# Patient Record
Sex: Female | Born: 2000
Health system: Southern US, Community
[De-identification: ages and names within clinical notes are randomized; demographics above are authoritative.]

## PROBLEM LIST (undated history)

## (undated) DIAGNOSIS — E282 Polycystic ovarian syndrome: Secondary | ICD-10-CM

## (undated) DIAGNOSIS — R519 Headache, unspecified: Secondary | ICD-10-CM

## (undated) DIAGNOSIS — R87619 Unspecified abnormal cytological findings in specimens from cervix uteri: Secondary | ICD-10-CM

## (undated) DIAGNOSIS — R002 Palpitations: Secondary | ICD-10-CM

## (undated) HISTORY — DX: Unspecified abnormal cytological findings in specimens from cervix uteri: R87.619

## (undated) HISTORY — PX: NO PAST SURGERIES: SHX2092

## (undated) HISTORY — DX: Headache, unspecified: R51.9

## (undated) HISTORY — DX: Palpitations: R00.2

## (undated) HISTORY — DX: Polycystic ovarian syndrome: E28.2

---
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
## 2018-01-06 ENCOUNTER — Encounter: Payer: Self-pay | Admitting: Physical Therapy

## 2018-01-06 ENCOUNTER — Ambulatory Visit: Payer: No Typology Code available for payment source | Admitting: Physical Therapy

## 2018-01-06 DIAGNOSIS — G8929 Other chronic pain: Secondary | ICD-10-CM

## 2018-01-06 DIAGNOSIS — M25511 Pain in right shoulder: Principal | ICD-10-CM

## 2018-01-06 DIAGNOSIS — M6281 Muscle weakness (generalized): Secondary | ICD-10-CM

## 2018-01-06 NOTE — Therapy (Signed)
[facility] Outpatient Rehabilitation [facility] [address] [place], [place], [address] Phone: [phone]   Fax:  [phone]  Physical Therapy Treatment  Patient Details  Name: Heather Mcneil MRN: [id number] Date of Birth: 25-Aug-2000 Referring Provider: Martha Clan MD    Encounter Date: 01/06/2018  PT End of Session - 01/06/18 1444    Visit Number  2    Number of Visits  9    Date for PT Re-Evaluation  01/20/18    Authorization Type  [facility] [employer] [employer]     Authorization Time Period  12/23/17 to 01/23/18    PT Start Time  1444    PT Stop Time  1530    PT Time Calculation (min)  46 min       History reviewed. No pertinent past medical history.  History reviewed. No pertinent surgical history.  There were no vitals filed for this visit.  Subjective Assessment - 01/06/18 1446    Subjective  Shoulder is feeling pretty good, only hurts when I move it overhead.     Currently in Pain?  No/denies                       [facility] Adult PT Treatment/Exercise - 01/06/18 0001      Shoulder Exercises: Prone   Retraction  15 reps    Extension  10 reps retraction + extension    Other Prone Exercises  90/90 lift from table; blackburn 1-4 in qped    Other Prone Exercises  Qped: Lt arm raise, green ball plyo rolls; protraction on elbows      Shoulder Exercises: Sidelying   Flexion  Right;10 reps;Weights 3 sets    ABduction  Right;10 reps;Weights 3 sets    ABduction Weight (lbs)  1      Shoulder Exercises: Body Blade   Other Body Blade Exercises  x30s ER, scaption, flexion in front of body, overhead      Manual Therapy   Manual Therapy  Taping    Soft tissue mobilization  IASTM Rt deltoid    Kinesiotex  Facilitate Muscle      Kinesiotix   Facilitate Muscle   scap retraction               PT Short Term Goals - 12/23/17 1307      PT SHORT TERM GOAL #1   Title  Patient to be compliant with appropriate HEP, to be updated PRN     Time  1    Period  Weeks    Status  New    Target Date  12/30/17      PT SHORT TERM GOAL #2   Title  Patient to report no  R shoulder pain when reaching overhead in order to improve QOL and assist in return to sport     Time  4    Period  Weeks    Status  New    Target Date  01/20/18      PT SHORT TERM GOAL #3   Title  Patient to be able to perform overhead tasks without scapular winging in order to show improved coordination and stability and assist in return to sport     Time  4    Period  Weeks    Status  New      PT SHORT TERM GOAL #4   Title  Patient to demonsrate MMT as being 5/5 in all tested groups in order to assist in return to  sport without pain     Time  4    Period  Weeks    Status  New      PT SHORT TERM GOAL #5   Title  Patient to be able to perform volleyball sports specific activities without pain in order to allow her to safely return to sport without fear of reinjury     Time  4    Period  Weeks    Status  New               Plan - 01/06/18 1550    Clinical Impression Statement  Fatigue notable when activating periscapular musculature. Overactivation of upper trap in motions above90 deg. Asked pt to transition HEP into a quadruped position. Discussed importance of postural alignment for shoulder movement.     PT Treatment/Interventions  ADLs/Self Care Home Management;Biofeedback;Cryotherapy;Electrical Stimulation;Iontophoresis 4mg /ml Dexamethasone;Moist Heat;Ultrasound;Functional mobility training;Therapeutic activities;Therapeutic exercise;Balance training;Neuromuscular re-education;Patient/family education;Manual techniques;Passive range of motion;Dry needling;Taping    PT Next Visit Plan  traps, rhomboids, lats strengthening, work on overhead strengthening and scapular coordination     PT Home Exercise Plan  Eval: blackburns exercises 1-4 in qped, thoracic extension     Consulted and Agree with Plan of Care  Patient       Patient will benefit from skilled  therapeutic intervention in order to improve the following deficits and impairments:  Improper body mechanics, Pain, Increased muscle spasms, Postural dysfunction, Decreased strength, Decreased range of motion, Impaired UE functional use  Visit Diagnosis: Chronic right shoulder pain  Muscle weakness (generalized)     Problem List There are no active problems to display for this patient.   Abhay Godbolt C. Ashely Joshua PT, DPT 01/06/18 3:52 PM   [facility] [address] [place], [place], [address] Phone: [phone]   Fax:  [phone]  Name: Virgina Norfolkshley V Mistry MRN: [id number] Date of Birth: 02/24/2001

## 2018-01-08 ENCOUNTER — Ambulatory Visit: Payer: No Typology Code available for payment source | Admitting: Physical Therapy

## 2018-01-08 ENCOUNTER — Encounter: Payer: Self-pay | Admitting: Physical Therapy

## 2018-01-08 DIAGNOSIS — M25511 Pain in right shoulder: Principal | ICD-10-CM

## 2018-01-08 DIAGNOSIS — G8929 Other chronic pain: Secondary | ICD-10-CM

## 2018-01-08 DIAGNOSIS — M6281 Muscle weakness (generalized): Secondary | ICD-10-CM

## 2018-01-08 NOTE — Therapy (Signed)
[facility] [address] [place], [place], [address] Phone: [phone]   Fax:  [phone]  Physical Therapy Treatment  Patient Details  Name: Heather Mcneil MRN: [id number] Date of Birth: 03/29/2001 Referring Provider: Martha ClanWilliam [name] MD    Encounter Date: 01/08/2018  PT End of Session - 01/08/18 1317    Visit Number  3    Number of Visits  9    Authorization Type  [facility] [employer]     PT Start Time  1315    PT Stop Time  1354    PT Time Calculation (min)  39 min    Activity Tolerance  Patient tolerated treatment well    Behavior During Therapy  [facility] for tasks assessed/performed       History reviewed. No pertinent past medical history.  History reviewed. No pertinent surgical history.  There were no vitals filed for this visit.  Subjective Assessment - 01/08/18 1317    Subjective  Feeling sore but denies pain    Patient Stated Goals  get back to volleyball without pain (starts October, workouts start in August)    Currently in Pain?  No/denies                       [facility] Treatment/Exercise - 01/08/18 0001      Exercises   Exercises  Other Exercises      Pilates   Pilates Reformer  reformer: planks, supine arm work      Shoulder Exercises: Prone   Other Prone Exercises  qped with yellow band: flexion, triceps extension, abd      Shoulder Exercises: Standing   External Rotation  Both;10 reps 3 sets, elbows on table, yellow tband    Other Standing Exercises  overhead reach yellow tband    Other Standing Exercises  overhead tennis ball bounce on wall      Shoulder Exercises: ROM/Strengthening   UBE (Upper Arm Bike)  retro L1 3 min      Manual Therapy   Soft tissue mobilization  IASTM Rt deltoid               PT Short Term Goals - 12/23/17 1307      PT SHORT TERM GOAL #1   Title  Patient to be compliant with appropriate HEP, to be updated PRN     Time  1    Period  Weeks    Status  New    Target Date  12/30/17      PT SHORT TERM GOAL #2   Title  Patient to report no  R shoulder pain when reaching overhead in order to improve QOL and assist in return to sport     Time  4    Period  Weeks    Status  New    Target Date  01/20/18      PT SHORT TERM GOAL #3   Title  Patient to be able to perform overhead tasks without scapular winging in order to show improved coordination and stability and assist in return to sport     Time  4    Period  Weeks    Status  New      PT SHORT TERM GOAL #4   Title  Patient to demonsrate MMT as being 5/5 in all tested groups in order to assist in return to sport without pain     Time  4    Period  Weeks    Status  New      PT SHORT TERM GOAL #5   Title  Patient to be able to perform volleyball sports specific activities without pain in order to allow her to safely return to sport without fear of reinjury     Time  4    Period  Weeks    Status  New               Plan - 01/08/18 1354    Clinical Impression Statement  Added yellow tband to qped exercises with fatigue but no pain. Pt able to correct scapular protraction with minimal assist at this visit. Pinching in shoulder noted on reformer when supine circles were wide to return overhead. pt is progressing well and will d/c at next visit if she has no pain.     PT Treatment/Interventions  ADLs/Self Care Home Management;Biofeedback;Cryotherapy;Electrical Stimulation;Iontophoresis 4mg /ml Dexamethasone;Moist Heat;Ultrasound;Functional mobility training;Therapeutic activities;Therapeutic exercise;Balance training;Neuromuscular re-education;Patient/family education;Manual techniques;Passive range of motion;Dry needling;Taping    PT Next Visit Plan  ask about ktape, d/c if no pain    PT Home Exercise Plan  Eval: blackburns exercises 1-4 in qped with yellow tband, thoracic extension; overhead ball bounce    Consulted and Agree with Plan of Care  Patient       Patient will  benefit from skilled therapeutic intervention in order to improve the following deficits and impairments:  Improper body mechanics, Pain, Increased muscle spasms, Postural dysfunction, Decreased strength, Decreased range of motion, Impaired UE functional use  Visit Diagnosis: Chronic right shoulder pain  Muscle weakness (generalized)     Problem List There are no active problems to display for this patient.  Kramer Hanrahan C. Nataleah Scioneaux PT, DPT 01/08/18 1:57 PM   [facility] [address] [place], [place], [address] Phone: [phone]   Fax:  [phone]  Name: Heather Mcneil MRN: [id number] Date of Birth: Aug 11, 2000

## 2018-01-13 ENCOUNTER — Ambulatory Visit: Payer: No Typology Code available for payment source | Attending: Internal Medicine | Admitting: Physical Therapy

## 2018-01-13 ENCOUNTER — Encounter: Payer: Self-pay | Admitting: Physical Therapy

## 2018-01-13 DIAGNOSIS — G8929 Other chronic pain: Secondary | ICD-10-CM | POA: Diagnosis present

## 2018-01-13 DIAGNOSIS — M25511 Pain in right shoulder: Secondary | ICD-10-CM | POA: Diagnosis not present

## 2018-01-13 DIAGNOSIS — M6281 Muscle weakness (generalized): Secondary | ICD-10-CM | POA: Insufficient documentation

## 2018-01-13 NOTE — Therapy (Signed)
[facility] Outpatient Rehabilitation [facility] [address] [place], [place], [address] Phone: [phone]   Fax:  [phone]  Physical Therapy Treatment  Patient Details  Name: Heather Mcneil MRN: [id number] Date of Birth: 2001-02-12 Referring Provider: Martha Clan MD    Encounter Date: 01/13/2018  PT End of Session - 01/13/18 1447    Visit Number  4    Number of Visits  9    Date for PT Re-Evaluation  01/20/18    Authorization Type  [facility] [employer] [employer]     Authorization Time Period  12/23/17 to 01/23/18    PT Start Time  1447    PT Stop Time  1526    PT Time Calculation (min)  39 min    Activity Tolerance  Patient tolerated treatment well    Behavior During Therapy  [facility] for tasks assessed/performed       History reviewed. No pertinent past medical history.  History reviewed. No pertinent surgical history.  There were no vitals filed for this visit.  Subjective Assessment - 01/13/18 1447    Subjective  Some pain when wiping a table and twinges while passing in volleyball.     Patient Stated Goals  get back to volleyball without pain (starts October, workouts start in August)    Currently in Pain?  No/denies         [facility] PT Assessment - 01/13/18 0001      AROM   Overall AROM Comments  all WFL, mild discomfort Rt GHJ IR      Strength   Right Shoulder Extension  4+/5    Right Shoulder Internal Rotation  5/5    Right Shoulder External Rotation  5/5    Right Shoulder Horizontal ABduction  4+/5    Left Shoulder Extension  5/5    Left Shoulder Internal Rotation  5/5    Left Shoulder External Rotation  5/5    Left Shoulder Horizontal ABduction  5/5    Right Elbow Flexion  5/5    Right Elbow Extension  5/5    Left Elbow Flexion  5/5    Left Elbow Extension  5/5      Palpation   Palpation comment  mild tenderness medial head of biceps and AC joint                   [facility] Adult PT Treatment/Exercise - 01/13/18 0001      Pilates   Pilates Reformer  supine arm work    Customer service manager  straight arm press bar, qped UE ext, high kneeling resisted ext from overhead, side plank resisted ADDuction all yellow      Manual Therapy   Manual therapy comments  skilled palpation and monitoring during TPDN    Soft tissue mobilization  deltoid       Trigger Point Dry Needling - 01/13/18 1529    Consent Given?  Yes    Education Handout Provided  -- verbal education    Muscles Treated Upper Body  -- deltoid           PT Education - 01/13/18 1528    Education Details  goals discussion, POC, TPDN & expected outcomes, exercise form/rationale. HEP    Person(s) Educated  Patient    Methods  Explanation;Demonstration;Tactile cues;Verbal cues;Handout    Comprehension  Verbalized understanding;Returned demonstration;Verbal cues required;Tactile cues required;Need further instruction       PT Short Term Goals - 01/13/18 1454      PT SHORT TERM  GOAL #1   Title  Patient to be compliant with appropriate HEP, to be updated PRN     Baseline  compliant and progressing    Status  On-going      PT SHORT TERM GOAL #2   Title  Patient to report no  R shoulder pain when reaching overhead in order to improve QOL and assist in return to sport     Baseline  some twinges    Status  On-going      PT SHORT TERM GOAL #3   Title  Patient to be able to perform overhead tasks without scapular winging in order to show improved coordination and stability and assist in return to sport     Baseline  winging noted with resistance overhead.    Status  On-going      PT SHORT TERM GOAL #4   Title  Patient to demonsrate MMT as being 5/5 in all tested groups in order to assist in return to sport without pain     Baseline  see flowsheet    Status  On-going      PT SHORT TERM GOAL #5   Title  Patient to be able to perform volleyball sports specific activities without pain in order to allow her to safely return to sport without fear of reinjury      Baseline  twinges when setting     Status  On-going               Plan - 01/13/18 1526    Clinical Impression Statement  Poor core activation in overhead motion leading to scapular winging and impingement. DN to medial head of biceps which created soreness with TTP- pt reported soreness as expected. Making progress toward goals but will benefit from further challenges    PT Treatment/Interventions  ADLs/Self Care Home Management;Biofeedback;Cryotherapy;Electrical Stimulation;Iontophoresis 4mg /ml Dexamethasone;Moist Heat;Ultrasound;Functional mobility training;Therapeutic activities;Therapeutic exercise;Balance training;Neuromuscular re-education;Patient/family education;Manual techniques;Passive range of motion;Dry needling;Taping    PT Next Visit Plan  dynamic overhead challenges- core activation cues    PT Home Exercise Plan  Eval: blackburns exercises 1-4 in qped with yellow tband, thoracic extension; overhead ball bounce, high kneeling resisted ext from overhead, side plank resisted adduction    Consulted and Agree with Plan of Care  Patient       Patient will benefit from skilled therapeutic intervention in order to improve the following deficits and impairments:  Improper body mechanics, Pain, Increased muscle spasms, Postural dysfunction, Decreased strength, Decreased range of motion, Impaired UE functional use  Visit Diagnosis: Chronic right shoulder pain  Muscle weakness (generalized)     Problem List There are no active problems to display for this patient.   Quintessa Simmerman C. Chaneka Trefz PT, DPT 01/13/18 3:30 PM   [facility] [address] [place], [place], [address] Phone: [phone]   Fax:  [phone]  Name: Heather Mcneil MRN: [id number] Date of Birth: 07/03/2000

## 2018-01-15 ENCOUNTER — Ambulatory Visit: Payer: No Typology Code available for payment source | Admitting: Physical Therapy

## 2018-01-20 ENCOUNTER — Ambulatory Visit: Payer: No Typology Code available for payment source | Admitting: Physical Therapy

## 2018-01-20 ENCOUNTER — Encounter: Payer: Self-pay | Admitting: Physical Therapy

## 2018-01-20 DIAGNOSIS — M25511 Pain in right shoulder: Principal | ICD-10-CM

## 2018-01-20 DIAGNOSIS — M6281 Muscle weakness (generalized): Secondary | ICD-10-CM

## 2018-01-20 DIAGNOSIS — G8929 Other chronic pain: Secondary | ICD-10-CM

## 2018-01-20 NOTE — Therapy (Signed)
[facility] [address] [place], [place], [address] Phone: [phone]   Fax:  [phone]  Physical Therapy Treatment  Patient Details  Name: Heather Mcneil MRN: [id number] Date of Birth: 01/21/2001 Referring Provider: Martha ClanWilliam [name] MD    Encounter Date: 01/20/2018  PT End of Session - 01/20/18 1311    Visit Number  5    Number of Visits  9    Date for PT Re-Evaluation  01/20/18    Authorization Type  [facility]     Authorization Time Period  12/23/17 to 01/23/18    PT Start Time  1310    PT Stop Time  1355    PT Time Calculation (min)  45 min    Activity Tolerance  Patient tolerated treatment well    Behavior During Therapy  [facility] assessed/per[facility]       History reviewed. No pertinent past medical history.  History reviewed. No pertinent surgical history.  There were no vitals filed for this visit.  Subjective Assessment - 01/20/18 1312    Subjective  Denies pain since last visit. mild sore post DN.    Patient Stated Goals  get back to volleyball without pain (starts October, workouts start in August)    Currently in Pain?  No/denies                       [facility] Treatment/Exercise - 01/20/18 0001      Exercises   Exercises  Shoulder    Other Exercises   exam of playing volleyball      Pilates   Pilates Tower  scaption pull yellow      Shoulder Exercises: Supine   Other Supine Exercises  bridge+mini sets      Shoulder Exercises: Prone   Other Prone Exercises  flexion in child pose      Shoulder Exercises: Sidelying   Other Sidelying Exercises  adduction yellow      Shoulder Exercises: Standing   Other Standing Exercises  wall angel rolling ball on wall    Other Standing Exercises  small ball (green) circles on wall      Shoulder Exercises: Stretch   Other Shoulder Stretches  child pose      Manual Therapy   Manual therapy comments  edu on use of theracane    Soft  tissue mobilization  Rt upper trap, scalenes, suboccipital release               PT Short Term Goals - 01/13/18 1454      PT SHORT TERM GOAL #1   Title  Patient to be compliant with appropriate HEP, to be updated PRN     Baseline  compliant and progressing    Status  On-going      PT SHORT TERM GOAL #2   Title  Patient to report no  R shoulder pain when reaching overhead in order to improve QOL and assist in return to sport     Baseline  some twinges    Status  On-going      PT SHORT TERM GOAL #3   Title  Patient to be able to perform overhead tasks without scapular winging in order to show improved coordination and stability and assist in return to sport     Baseline  winging noted with resistance overhead.    Status  On-going      PT SHORT TERM GOAL #4   Title  Patient to demonsrate MMT as being 5/5 in all tested groups in order to assist in return to sport without pain     Baseline  see flowsheet    Status  On-going      PT SHORT TERM GOAL #5   Title  Patient to be able to perform volleyball sports specific activities without pain in order to allow her to safely return to sport without fear of reinjury     Baseline  twinges when setting     Status  On-going               Plan - 01/20/18 1313    Clinical Impression Statement  *correction from last visit- Medial head of DELTOID was DN.  Pt reported being stressed today which was notable by tightness in her upper traps. Pt is able to perform overhead motions but does demo instability in Rt shoulder vs Lt. I asked her to try all activities that she knew to create shoulder pain in the past and we will determine d/c at next visit based on outcomes.     PT Treatment/Interventions  ADLs/Self Care Home Management;Biofeedback;Cryotherapy;Electrical Stimulation;Iontophoresis 4mg /ml Dexamethasone;Moist Heat;Ultrasound;Functional mobility training;Therapeutic activities;Therapeutic exercise;Balance training;Neuromuscular  re-education;Patient/family education;Manual techniques;Passive range of motion;Dry needling;Taping    PT Next Visit Plan  dynamic overhead challenges- core activation cues    PT Home Exercise Plan  Eval: blackburns exercises 1-4 in qped with yellow tband, thoracic extension; overhead ball bounce, high kneeling resisted ext from overhead, side plank resisted adduction; GHJ flx in child pose, wall angel with ball    Consulted and Agree with Plan of Care  Patient       Patient will benefit from skilled therapeutic intervention in order to improve the following deficits and impairments:  Improper body mechanics, Pain, Increased muscle spasms, Postural dysfunction, Decreased strength, Decreased range of motion, Impaired UE functional use  Visit Diagnosis: Chronic right shoulder pain  Muscle weakness (generalized)     Problem List There are no active problems to display for this patient.   Zayra Devito C. Constantina Laseter PT, DPT 01/20/18 1:58 PM   [facility] [address] [place], [place], [address] Phone: [phone]   Fax:  [phone]  Name: Heather Mcneil MRN: [id number] Date of Birth: 01/25/2001

## 2018-01-22 ENCOUNTER — Encounter: Payer: Self-pay | Admitting: Physical Therapy

## 2018-01-22 ENCOUNTER — Ambulatory Visit: Payer: No Typology Code available for payment source | Admitting: Physical Therapy

## 2018-01-22 DIAGNOSIS — M25511 Pain in right shoulder: Secondary | ICD-10-CM | POA: Diagnosis not present

## 2018-01-22 DIAGNOSIS — M6281 Muscle weakness (generalized): Secondary | ICD-10-CM

## 2018-01-22 DIAGNOSIS — G8929 Other chronic pain: Secondary | ICD-10-CM

## 2018-01-22 NOTE — Therapy (Signed)
[facility] [address] [place], [place], [address] Phone: [phone]   Fax:  [phone]  Physical Therapy Treatment  Patient Details  Name: Heather Mcneil MRN: [id number] Date of Birth: 05/22/2001 Referring Provider: Martha ClanWilliam [name], MD   Encounter Date: 01/22/2018  PT End of Session - 01/22/18 1321    Visit Number  6    Number of Visits  14    Date for PT Re-Evaluation  02/21/18    Authorization Type  [employer]     Authorization Time Period  --    PT Start Time  1318    PT Stop Time  1400    PT Time Calculation (min)  42 min    Activity Tolerance  Patient tolerated treatment well    Behavior During Therapy  [facility] for tasks assessed/performed       History reviewed. No pertinent past medical history.  History reviewed. No pertinent surgical history.  There were no vitals filed for this visit.  Subjective Assessment - 01/22/18 1320    Subjective  I tried playing volleyball, when I tried to hit like I normally do it hurt and hurt when setting after that.     Patient Stated Goals  get back to volleyball without pain (starts October, workouts start in August)    Currently in Pain?  Yes    Pain Score  1     Pain Location  Shoulder    Pain Orientation  Right    Pain Descriptors / Indicators  Sore         [facility] PT Assessment - 01/22/18 0001      Assessment   Medical Diagnosis  shoulder pain     Referring Provider  Martha ClanWilliam [name], MD                   [facility] Treatment/Exercise - 01/22/18 0001      Exercises   Exercises  Shoulder      Shoulder Exercises: Seated   Other Seated Exercises  Rt UE diagonal red plyo c sit on bosu& high kneeling on bosu      Shoulder Exercises: Prone   Other Prone Exercises  flexion in child pose    Other Prone Exercises  primal push up      Shoulder Exercises: Standing   Flexion Limitations  overheal ball liftoff from wall red plyo    Other Standing Exercises   high kneeling resisted IR/ER green tband- quick & slow      Shoulder Exercises: ROM/Strengthening   UBE (Upper Arm Bike)  retro 3 min L1      Shoulder Exercises: Body Blade   Flexion Limitations  at 90 in supine x3 blade //to body and perpendicular      Manual Therapy   Soft tissue mobilization  IASTM Rt deltoid               PT Short Term Goals - 01/13/18 1454      PT SHORT TERM GOAL #1   Title  Patient to be compliant with appropriate HEP, to be updated PRN     Baseline  compliant and progressing    Status  On-going      PT SHORT TERM GOAL #2   Title  Patient to report no  R shoulder pain when reaching overhead in order to improve QOL and assist in return to sport     Baseline  some twinges    Status  On-going  PT SHORT TERM GOAL #3   Title  Patient to be able to perform overhead tasks without scapular winging in order to show improved coordination and stability and assist in return to sport     Baseline  winging noted with resistance overhead.    Status  On-going      PT SHORT TERM GOAL #4   Title  Patient to demonsrate MMT as being 5/5 in all tested groups in order to assist in return to sport without pain     Baseline  see flowsheet    Status  On-going      PT SHORT TERM GOAL #5   Title  Patient to be able to perform volleyball sports specific activities without pain in order to allow her to safely return to sport without fear of reinjury     Baseline  twinges when setting     Status  On-going               Plan - 01/22/18 1537    Clinical Impression Statement  continued pain with forceful and quick overhead motions due to poor motor control. Pt has difficulty engaging abdominal wall overhead creating excess demand on shoulder. Due to continued impingement, pt will benefit from further skilled PT to properly train motor control and reduce future injury risk.     PT Frequency  2x / week    PT Duration  4 weeks    PT Treatment/Interventions   ADLs/Self Care Home Management;Biofeedback;Cryotherapy;Electrical Stimulation;Iontophoresis 4mg /ml Dexamethasone;Moist Heat;Ultrasound;Functional mobility training;Therapeutic activities;Therapeutic exercise;Balance training;Neuromuscular re-education;Patient/family education;Manual techniques;Passive range of motion;Dry needling;Taping    PT Next Visit Plan  dynamic overhead challenges- core activation cues    PT Home Exercise Plan  Eval: blackburns exercises 1-4 in qped with yellow tband, thoracic extension; overhead ball bounce, high kneeling resisted ext from overhead, side plank resisted adduction; GHJ flx in child pose, wall angel with ball, high kneeling D2 ext with weight, resisted ER at 90, primal push up + serratus;     Consulted and Agree with Plan of Care  Patient       Patient will benefit from skilled therapeutic intervention in order to improve the following deficits and impairments:  Improper body mechanics, Pain, Increased muscle spasms, Postural dysfunction, Decreased strength, Decreased range of motion, Impaired UE functional use  Visit Diagnosis: Chronic right shoulder pain - Plan: PT plan of care cert/re-cert  Muscle weakness (generalized) - Plan: PT plan of care cert/re-cert     Problem List There are no active problems to display for this patient.   Harmony Sandell C. Ryden Wainer PT, DPT 01/22/18 3:43 PM   [facility] [address] [place], [place], [address] Phone: [phone]   Fax:  [phone]  Name: Heather Mcneil MRN: [id number] Date of Birth: 03/15/2001

## 2018-01-24 MED FILL — ALYACEN 1-35-28 TABLET: 1-35 | 84 days supply | Qty: 84 | Fill #1

## 2018-02-07 ENCOUNTER — Ambulatory Visit: Payer: No Typology Code available for payment source | Admitting: Physical Therapy

## 2018-02-07 ENCOUNTER — Encounter: Payer: Self-pay | Admitting: Physical Therapy

## 2018-02-07 DIAGNOSIS — G8929 Other chronic pain: Secondary | ICD-10-CM

## 2018-02-07 DIAGNOSIS — M25511 Pain in right shoulder: Secondary | ICD-10-CM | POA: Diagnosis not present

## 2018-02-07 DIAGNOSIS — M6281 Muscle weakness (generalized): Secondary | ICD-10-CM

## 2018-02-07 NOTE — Therapy (Signed)
[facility] Outpatient Rehabilitation [facility] [address] [place], [place], [address] Phone: [phone]   Fax:  [phone]  Physical Therapy Treatment  Patient Details  Name: Heather Mcneil MRN: [id number] Date of Birth: 03-10-2001 Referring Provider: Martha Clan, MD   Encounter Date: 02/07/2018  PT End of Session - 02/07/18 0807    Visit Number  7    Number of Visits  14    Date for PT Re-Evaluation  02/21/18    Authorization Type  [employer] [employer]     PT Start Time  0807    PT Stop Time  [phone]    PT Time Calculation (min)  35 min    Activity Tolerance  Patient tolerated treatment well    Behavior During Therapy  [facility] for tasks assessed/performed       History reviewed. No pertinent past medical history.  History reviewed. No pertinent surgical history.  There were no vitals filed for this visit.  Subjective Assessment - 02/07/18 0808    Subjective  It hurt a little after our last session for a few days.     Patient Stated Goals  get back to volleyball without pain (starts October, workouts start in August)    Currently in Pain?  No/denies                       [facility] Adult PT Treatment/Exercise - 02/07/18 0001      Exercises   Exercises  Shoulder      Shoulder Exercises: Seated   Horizontal ABduction Limitations  iso hold yellow tband with bridge rollout on physioball    External Rotation  15 reps   2 sets   External Rotation Weight (lbs)  2    External Rotation Limitations  elbow on table    Other Seated Exercises  Rt UE diagonal seated on physioball 2#      Shoulder Exercises: Standing   External Rotation  20 reps;Right    Theraband Level (Shoulder External Rotation)  Level 3 (Green)    Extension  20 reps    Theraband Level (Shoulder Extension)  Level 3 (Green)    Row  20 reps    Theraband Level (Shoulder Row)  Level 4 (Blue)      Modalities   Modalities  Ultrasound      Ultrasound   Ultrasound Location  Rt GHJ    Ultrasound Parameters  1.0w/cm2 pulsed 8 min    Ultrasound Goals  Pain               PT Short Term Goals - 01/13/18 1454      PT SHORT TERM GOAL #1   Title  Patient to be compliant with appropriate HEP, to be updated PRN     Baseline  compliant and progressing    Status  On-going      PT SHORT TERM GOAL #2   Title  Patient to report no  R shoulder pain when reaching overhead in order to improve QOL and assist in return to sport     Baseline  some twinges    Status  On-going      PT SHORT TERM GOAL #3   Title  Patient to be able to perform overhead tasks without scapular winging in order to show improved coordination and stability and assist in return to sport     Baseline  winging noted with resistance overhead.    Status  On-going  PT SHORT TERM GOAL #4   Title  Patient to demonsrate MMT as being 5/5 in all tested groups in order to assist in return to sport without pain     Baseline  see flowsheet    Status  On-going      PT SHORT TERM GOAL #5   Title  Patient to be able to perform volleyball sports specific activities without pain in order to allow her to safely return to sport without fear of reinjury     Baseline  twinges when setting     Status  On-going               Plan - 02/07/18 [id number]    Clinical Impression Statement  Continued scapular winging noted creating poor stability through movement. HEP to row/ext/ER with higher level bands for increased periscapular activation. Cues required for core activation for trunk control when Rt arm works above 90 deg.     PT Treatment/Interventions  ADLs/Self Care Home Management;Biofeedback;Cryotherapy;Electrical Stimulation;Iontophoresis 4mg /ml Dexamethasone;Moist Heat;Ultrasound;Functional mobility training;Therapeutic activities;Therapeutic exercise;Balance training;Neuromuscular re-education;Patient/family education;Manual techniques;Passive range of motion;Dry needling;Taping    PT Next Visit Plan  dynamic  overhead challenges- core activation cues. US effective? consider ktape    PT Home Exercise Plan  Eval: blackburns exercises 1-4 in qped with yellow tband, thoracic extension; overhead ball bounce, high kneeling resisted ext from overhead, side plank resisted adduction; GHJ flx in child pose, wall angel with ball, high kneeling D2 ext with weight, resisted ER at 90, primal push up + serratus;     Consulted and Agree with Plan of Care  Patient       Patient will benefit from skilled therapeutic intervention in order to improve the following deficits and impairments:  Improper body mechanics, Pain, Increased muscle spasms, Postural dysfunction, Decreased strength, Decreased range of motion, Impaired UE functional use  Visit Diagnosis: Chronic right shoulder pain  Muscle weakness (generalized)     Problem List There are no active problems to display for this patient.  Brayden Brodhead C. Danaysia Rader PT, DPT 02/07/18 8:45 AM   [facility] [address] [place], [place], [address] Phone: [phone]   Fax:  [phone]  Name: Heather Mcneil MRN: [id number] Date of Birth: 07/09/2000

## 2018-02-13 ENCOUNTER — Ambulatory Visit: Payer: No Typology Code available for payment source | Admitting: Physical Therapy

## 2018-02-17 ENCOUNTER — Ambulatory Visit: Payer: No Typology Code available for payment source | Attending: Internal Medicine | Admitting: Physical Therapy

## 2018-02-17 ENCOUNTER — Encounter: Payer: Self-pay | Admitting: Physical Therapy

## 2018-02-17 DIAGNOSIS — G8929 Other chronic pain: Secondary | ICD-10-CM | POA: Diagnosis present

## 2018-02-17 DIAGNOSIS — M6281 Muscle weakness (generalized): Secondary | ICD-10-CM | POA: Diagnosis present

## 2018-02-17 DIAGNOSIS — M25511 Pain in right shoulder: Secondary | ICD-10-CM | POA: Insufficient documentation

## 2018-02-17 NOTE — Therapy (Signed)
[facility] [address] [place], [place], [address] Phone: [phone]   Fax:  [phone]  Physical Therapy Treatment  Patient Details  Name: Heather Mcneil MRN: [id number] Date of Birth: [birth year] Referring Provider: Martha Clan, MD   Encounter Date: 02/17/2018  PT End of Session - 02/17/18 1331    Visit Number  8    Number of Visits  14    Date for PT Re-Evaluation  02/28/18    Authorization Type  [facility] [employer]     Authorization Time Period  12/23/17 to 01/23/18    PT Start Time  1330    PT Stop Time  1411    PT Time Calculation (min)  41 min    Activity Tolerance  Patient tolerated treatment well    Behavior During Therapy  [facility] for tasks assessed/performed       History reviewed. No pertinent past medical history.  History reviewed. No pertinent surgical history.  There were no vitals filed for this visit.                    [facility] Adult PT Treatment/Exercise - 02/17/18 0001      Exercises   Exercises  Shoulder      Shoulder Exercises: Prone   Other Prone Exercises  flexion in child pose    Other Prone Exercises  primal push up 3x10 serratus press      Shoulder Exercises: Sidelying   External Rotation Limitations  red plyo ball, ER to abduction overhead      Shoulder Exercises: Standing   Flexion Limitations  --    Other Standing Exercises  high kneeling Rt UE diagonals red plyoball, hinge back with flexion for core activation    Other Standing Exercises  red plyoball on wall circles 90, 120, end range      Shoulder Exercises: ROM/Strengthening   UBE (Upper Arm Bike)  2:30/2:30 L2      Shoulder Exercises: Stretch   Other Shoulder Stretches  flexion at wall, door pec stretch      Shoulder Exercises: Body Blade   Other Body Blade Exercises  ER at neutral, ER+ 45 ABD, 90/90, OH  1 min each      Manual Therapy   Manual therapy comments  scapular distraction    Soft tissue mobilization   periscapular STM               PT Short Term Goals - 01/13/18 1454      PT SHORT TERM GOAL #1   Title  Patient to be compliant with appropriate HEP, to be updated PRN     Baseline  compliant and progressing    Status  On-going      PT SHORT TERM GOAL #2   Title  Patient to report no  R shoulder pain when reaching overhead in order to improve QOL and assist in return to sport     Baseline  some twinges    Status  On-going      PT SHORT TERM GOAL #3   Title  Patient to be able to perform overhead tasks without scapular winging in order to show improved coordination and stability and assist in return to sport     Baseline  winging noted with resistance overhead.    Status  On-going      PT SHORT TERM GOAL #4   Title  Patient to demonsrate MMT as being 5/5 in all tested groups in order  to assist in return to sport without pain     Baseline  see flowsheet    Status  On-going      PT SHORT TERM GOAL #5   Title  Patient to be able to perform volleyball sports specific activities without pain in order to allow her to safely return to sport without fear of reinjury     Baseline  twinges when setting     Status  On-going               Plan - 02/17/18 1351    Clinical Impression Statement  consolidated HEP today for appropriate challenge. fatigue with difficulty controlling scapular movement above 90 deg. manual therapy to improve scapular mobility and decrease trigger point discomfort.     PT Treatment/Interventions  ADLs/Self Care Home Management;Biofeedback;Cryotherapy;Electrical Stimulation;Iontophoresis 4mg /ml Dexamethasone;Moist Heat;Ultrasound;Functional mobility training;Therapeutic activities;Therapeutic exercise;Balance training;Neuromuscular re-education;Patient/family education;Manual techniques;Passive range of motion;Dry needling;Taping    PT Next Visit Plan  dynamic overhead challenges- core activation cues. [place] and tape PRN    PT Home Exercise Plan  overhead  ball bounce, high kneeling D2 + hinge, primal push up + serratus, child pose+GHJ flx    Consulted and Agree with Plan of Care  Patient       Patient will benefit from skilled therapeutic intervention in order to improve the following deficits and impairments:  Improper body mechanics, Pain, Increased muscle spasms, Postural dysfunction, Decreased strength, Decreased range of motion, Impaired UE functional use  Visit Diagnosis: Chronic right shoulder pain  Muscle weakness (generalized)     Problem List There are no active problems to display for this patient.   Ayse Mccartin C. Chaniya Genter PT, DPT 02/17/18 2:14 PM   [facility] [address] [place], [place], [address] Phone: [phone]   Fax:  [phone]  Name: RANETTE HARMSEN MRN: [id number] Date of Birth: [birth year]

## 2018-02-21 ENCOUNTER — Ambulatory Visit: Payer: No Typology Code available for payment source | Admitting: Physical Therapy

## 2018-02-24 ENCOUNTER — Encounter: Payer: Self-pay | Admitting: Physical Therapy

## 2018-02-24 ENCOUNTER — Ambulatory Visit: Payer: No Typology Code available for payment source | Admitting: Physical Therapy

## 2018-02-24 DIAGNOSIS — G8929 Other chronic pain: Secondary | ICD-10-CM

## 2018-02-24 DIAGNOSIS — M25511 Pain in right shoulder: Principal | ICD-10-CM

## 2018-02-24 DIAGNOSIS — M6281 Muscle weakness (generalized): Secondary | ICD-10-CM

## 2018-02-24 NOTE — Therapy (Signed)
[place], [place], [address] Phone: [phone]   Fax:  [phone]  Physical Therapy Treatment/Discharge Summary  Patient Details  Name: Heather Mcneil MRN: [id number] Date of Birth: Mar 02, 2001 Referring Provider: Marton Redwood, MD   Encounter Date: 02/24/2018  PT End of Session - 02/24/18 1146    Visit Number  9    Number of Visits  14    Date for PT Re-Evaluation  02/28/18    Authorization Type  [facility] [employer]     PT Start Time  7353    PT Stop Time  1159    PT Time Calculation (min)  14 min    Activity Tolerance  Patient tolerated treatment well    Behavior During Therapy  [facility] for tasks assessed/performed       History reviewed. No pertinent past medical history.  History reviewed. No pertinent surgical history.  There were no vitals filed for this visit.  Subjective Assessment - 02/24/18 1146    Subjective  Tried hitting some- hitting against a wall. Began having pain so she stopped before it became sharp .     Patient Stated Goals  get back to volleyball without pain (starts October, workouts start in August)    Currently in Pain?  Yes    Pain Score  1     Pain Location  Shoulder    Pain Orientation  Right         [facility] PT Assessment - 02/24/18 0001      Assessment   Medical Diagnosis  shoulder pain     Referring Provider  Marton Redwood, MD      AROM   Overall AROM Comments  all WFL, discomfort noted at 90 abd when paired with end range ER      Strength   Right Shoulder Extension  5/5    Right Shoulder Internal Rotation  5/5    Right Shoulder External Rotation  5/5    Right Shoulder Horizontal ABduction  5/5    Left Shoulder Extension  5/5    Left Shoulder Internal Rotation  5/5    Left Shoulder External Rotation  5/5    Left Shoulder Horizontal ABduction  5/5    Right Elbow Flexion  5/5    Right Elbow Extension  5/5    Left Elbow Flexion  5/5    Left Elbow Extension  5/5       Palpation   Palpation comment  denies TTP                           PT Education - 02/24/18 1202    Education Details  goals discussion, importance of continued HEP    Person(s) Educated  Patient    Methods  Explanation    Comprehension  Verbalized understanding       PT Short Term Goals - 02/24/18 1152      PT SHORT TERM GOAL #1   Title  Patient to be compliant with appropriate HEP, to be updated PRN     Status  Achieved      PT SHORT TERM GOAL #2   Title  Patient to report no  R shoulder pain when reaching overhead in order to improve QOL and assist in return to sport     Baseline  able to do all activities, begin to feel it if she has her hand up to curl her hair for long  periods    Status  Partially Met      PT SHORT TERM GOAL #3   Title  Patient to be able to perform overhead tasks without scapular winging in order to show improved coordination and stability and assist in return to sport     Status  Achieved      PT SHORT TERM GOAL #4   Title  Patient to demonsrate MMT as being 5/5 in all tested groups in order to assist in return to sport without pain     Status  Achieved      PT SHORT TERM GOAL #5   Title  Patient to be able to perform volleyball sports specific activities without pain in order to allow her to safely return to sport without fear of reinjury     Baseline  pain with hitting, can hit for longer now but still has pain    Status  Not Met               Plan - 02/24/18 1202    Clinical Impression Statement  Pt has met all of her goals at this time with the exception of sports-related activities. continues to have sharp pain in position of abduction and external rotation as is hitting a volleyball and reaching behind her head to curl her hair. At this time we agreed that it is time to return to MD due to lack of change in these aspects. Discussed stress placed on labrum in positions of pain and I told her that she does not have  to quit playing volleyball, just know that she needs to stop when pain begins. 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 She was encouraged to contact us with any further questions.     PT Treatment/Interventions  ADLs/Self Care Home Management;Biofeedback;Cryotherapy;Electrical Stimulation;Iontophoresis 15m/ml Dexamethasone;Moist Heat;Ultrasound;Functional mobility training;Therapeutic activities;Therapeutic exercise;Balance training;Neuromuscular re-education;Patient/family education;Manual techniques;Passive range of motion;Dry needling;Taping    PT Home Exercise Plan  overhead ball bounce, high kneeling D2 + hinge, primal push up + serratus, child pose+GHJ flx    Consulted and Agree with Plan of Care  Patient       Patient will benefit from skilled therapeutic intervention in order to improve the following deficits and impairments:  Improper body mechanics, Pain, Increased muscle spasms, Postural dysfunction, Decreased strength, Decreased range of motion, Impaired UE functional use  Visit Diagnosis: Chronic right shoulder pain  Muscle weakness (generalized)     Problem List There are no active problems to display for this patient.   Ishika Chesterfield C. Timber Lucarelli PT, DPT 02/24/18 12:12 PM   [place] [address]Phone: [phone]  Fax:  [phone] Name: Heather KACHMARMRN: [id number] of Birth: 926-Sep-2002

## 2018-02-27 ENCOUNTER — Ambulatory Visit: Payer: No Typology Code available for payment source | Admitting: Physical Therapy

## 2018-03-28 MED FILL — CLINDAMYCIN PHOS-BENZOYL PE: 1-5 | 25 days supply | Qty: 25 | Fill #2

## 2018-04-23 MED FILL — KETOCONAZOLE 2% SHAMPOO: 2 | 30 days supply | Qty: 120 | Fill #4

## 2018-04-23 MED FILL — ALYACEN 1-35-28 TABLET: 1-35 | 84 days supply | Qty: 84 | Fill #2

## 2018-08-29 MED FILL — FEMYNOR 0.25-35 MG-MCG TABS: 0.25-35 | 84 days supply | Qty: 84 | Fill #0

## 2018-09-08 MED FILL — KETOCONAZOLE 2% SHAMPOO: 2 | 30 days supply | Qty: 120 | Fill #0

## 2018-09-08 MED FILL — CLINDAMYCIN PHOS-BENZOYL PE: 1-5 | 25 days supply | Qty: 25 | Fill #0

## 2018-09-25 MED FILL — PROCTOZONE-HC 2.5 % CREA: 2.5 | 15 days supply | Qty: 30 | Fill #0

## 2018-10-13 ENCOUNTER — Encounter: Payer: Self-pay | Admitting: General Surgery

## 2018-10-14 ENCOUNTER — Encounter: Payer: Self-pay | Admitting: Gastroenterology

## 2018-10-14 ENCOUNTER — Other Ambulatory Visit: Payer: Self-pay

## 2018-10-14 ENCOUNTER — Ambulatory Visit (INDEPENDENT_AMBULATORY_CARE_PROVIDER_SITE_OTHER): Payer: No Typology Code available for payment source | Admitting: Gastroenterology

## 2018-10-14 VITALS — Ht 68.0 in | Wt 143.0 lb

## 2018-10-14 DIAGNOSIS — R140 Abdominal distension (gaseous): Secondary | ICD-10-CM

## 2018-10-14 DIAGNOSIS — R195 Other fecal abnormalities: Secondary | ICD-10-CM | POA: Diagnosis not present

## 2018-10-14 DIAGNOSIS — R1011 Right upper quadrant pain: Secondary | ICD-10-CM | POA: Diagnosis not present

## 2018-10-14 DIAGNOSIS — G8929 Other chronic pain: Secondary | ICD-10-CM

## 2018-10-14 DIAGNOSIS — K625 Hemorrhage of anus and rectum: Secondary | ICD-10-CM

## 2018-10-14 MED ORDER — NA SULFATE-K SULFATE-MG SULF 17.5-3.13-1.6 GM/177ML PO SOLN: 1.0000 | Freq: Once | ORAL | 0 refills | Status: AC

## 2018-10-14 NOTE — Progress Notes (Signed)
Heather Mcneil    [id number]    01/12/2001  Primary Care Physician:[name], [name], MD  Referring Physician: [name], [name], MD [address] [place], [place] [id number]  This service was provided via audio and video telemedicine (Doximity) due to COVID 19 pandemic.  Patient location: Home Provider location: Office Used 2 patient identifiers to confirm the correct person. Explained the limitations in evaluation and management via telemedicine. Patient is aware of potential medical charges for this visit.  Patient consented to this virtual visit.  The persons participating in this telemedicine service were myself and the patient   Chief complaint: Rectal bleeding HPI:  18 year old female with no significant medical history, with complaints of rectal bleeding 2 weeks ago  She she noticed bright red blood when she wiped after bowel movement 2 weeks ago.  Subsequent fecal Hemoccult was positive.  She has not had any bright red blood since then. Denies any change in bowel habits, no constipation or diarrhea.  She has chronic right upper quadrant discomfort on and off.  No association with diet or activity.  Denies nausea, vomiting, dysphagia, loss of appetite or weight loss.  She has intermittent night sweats.  Review of system positive for rash in the scalp.  Denies any joint pains.  Family history of celiac disease, IBD or GI malignancy.  Abdominal ultrasound June 21, 2017 for right upper quadrant abdominal pain Normal gallbladder with no gallstones or wall thickening.  No acute abnormality    Outpatient Encounter Medications as of 10/14/2018  Medication Sig  . cetirizine (ZYRTEC) 10 MG tablet Take 10 mg by mouth daily.  . norgestimate-ethinyl estradiol (FEMYNOR) 0.25-35 MG-MCG tablet Take 1 tablet by mouth daily.   No facility-administered encounter medications on file as of 10/14/2018.     Allergies as of 10/14/2018  . (No Known Allergies)    History  reviewed. No pertinent past medical history.  History reviewed. No pertinent surgical history.  Family History  Problem Relation Age of Onset  . Heart disease Maternal Grandmother     Social History   Socioeconomic History  . Marital status: Single    Spouse name: Not on file  . Number of children: 0  . Years of education: 6612  . Highest education level: Not on file  Occupational History  . Not on file  Social Needs  . Financial resource strain: Not on file  . Food insecurity:    Worry: Not on file    Inability: Not on file  . Transportation needs:    Medical: Not on file    Non-medical: Not on file  Tobacco Use  . Smoking status: Never Smoker  . Smokeless tobacco: Never Used  Substance and Sexual Activity  . Alcohol use: Not Currently  . Drug use: Not Currently  . Sexual activity: Not Currently    Birth control/protection: Abstinence, Pill  Lifestyle  . Physical activity:    Days per week: Not on file    Minutes per session: Not on file  . Stress: Not on file  Relationships  . Social connections:    Talks on phone: Not on file    Gets together: Not on file    Attends religious service: Not on file    Active member of club or organization: Not on file    Attends meetings of clubs or organizations: Not on file    Relationship status: Not on file  . Intimate partner violence:  Fear of current or ex partner: Not on file    Emotionally abused: Not on file    Physically abused: Not on file    Forced sexual activity: Not on file  Other Topics Concern  . Not on file  Social History Narrative  . Not on file      Review of systems: Review of Systems as per HPI All other systems reviewed and are negative.   Physical Exam: Vitals were not taken and physical exam was not performed during this virtual visit.  Data Reviewed:  Reviewed labs, radiology imaging, old records and pertinent past GI work up   Assessment and Plan/Recommendations:  18 year old  female with intermittent bloating, right upper quadrant abdominal discomfort, episode of bright red blood per rectum 2 weeks ago and heme positive stool  Check CMP, TTG IgA antibody and IgA level to exclude celiac disease Check CRP ?  Crohn's/IBD  CBC normal in February 2020  Will proceed with colonoscopy for further evaluation of rectal bleeding and exclude inflammatory bowel disease The risks and benefits as well as alternatives of endoscopic procedure(s) have been discussed and reviewed. All questions answered. The patient agrees to proceed.  Avoid NSAIDs  Follow-up visit after colonoscopy    K. Scherry Ran , MD   CC: Martha Clan, MD

## 2018-10-14 NOTE — Patient Instructions (Addendum)
CMP, TTG IgA antibody and IgA level to exclude celiac disease  CRP   Come to the lab in our basement at [facility] to get these labs drawn  Lab Hours are 7:30am to 4 pm   CBC normal in February 2020  Will proceed with colonoscopy for further evaluation of rectal bleeding and exclude inflammatory bowel disease  Avoid NSAIDs  Follow-up visit after colonoscopy  You have been scheduled for a colonoscopy. Please follow written instructions given to you at your visit today.  Please pick up your prep supplies at the pharmacy within the next 1-3 days. If you use inhalers (even only as needed), please bring them with you on the day of your procedure.  We have mailed you instructions for your colonoscopy today, please return patient acknowledgement form back in self addressed envelope as soon as possible When you receive your instructions call us with any questions you may have     I appreciate the  opportunity to care for you  Thank You   Marsa Aris , MD

## 2018-10-23 ENCOUNTER — Other Ambulatory Visit (INDEPENDENT_AMBULATORY_CARE_PROVIDER_SITE_OTHER): Payer: No Typology Code available for payment source

## 2018-10-23 DIAGNOSIS — G8929 Other chronic pain: Secondary | ICD-10-CM

## 2018-10-23 DIAGNOSIS — R140 Abdominal distension (gaseous): Secondary | ICD-10-CM

## 2018-10-23 DIAGNOSIS — R195 Other fecal abnormalities: Secondary | ICD-10-CM | POA: Diagnosis not present

## 2018-10-23 DIAGNOSIS — R1011 Right upper quadrant pain: Secondary | ICD-10-CM | POA: Diagnosis not present

## 2018-10-23 DIAGNOSIS — K625 Hemorrhage of anus and rectum: Secondary | ICD-10-CM

## 2018-10-23 LAB — C-REACTIVE PROTEIN: CRP: <1 mg/dL (ref 0.5–20.0)

## 2018-10-23 LAB — COMPREHENSIVE METABOLIC PANEL
ALT: 12 U/L (ref 0–35)
AST: 15 U/L (ref 0–37)
Albumin: 4.4 g/dL (ref 3.5–5.2)
Alkaline Phosphatase: 52 U/L (ref 47–119)
BUN: 6 mg/dL (ref 6–23)
CO2: 24 mEq/L (ref 19–32)
Calcium: 9.4 mg/dL (ref 8.4–10.5)
Chloride: 105 mEq/L (ref 96–112)
Creatinine, Ser: 0.8 mg/dL (ref 0.40–1.20)
GFR: 91.74 mL/min (ref >60.00)
Glucose, Bld: 87 mg/dL (ref 70–99)
Potassium: 3.9 mEq/L (ref 3.5–5.1)
Sodium: 137 mEq/L (ref 135–145)
Total Bilirubin: 0.5 mg/dL (ref 0.2–1.2)
Total Protein: 7.4 g/dL (ref 6.0–8.3)

## 2018-10-23 LAB — IGA: IgA: 154 mg/dL (ref 68–378)

## 2018-10-23 MED FILL — SUPREP BOWEL PREP KIT: 17.5-3.13-1 | 1 days supply | Qty: 354 | Fill #0

## 2018-10-24 LAB — TISSUE TRANSGLUTAMINASE ABS,IGG,IGA
(tTG) Ab, IgA: 1 U/mL
(tTG) Ab, IgG: 3 U/mL

## 2018-10-26 ENCOUNTER — Telehealth: Payer: Self-pay | Admitting: *Deleted

## 2018-10-26 NOTE — Telephone Encounter (Signed)
Covid-19 travel screening questions  Have you traveled in the last 14 days? No If yes where?  Do you now or have you had a fever in the last 14 days? NO  Do you have any respiratory symptoms of shortness of breath or cough now or in the last 14 days? No  Do you have a medical history of Congestive Heart Failure?  Do you have a medical history of lung disease?  Do you have any family members or close contacts with diagnosed or suspected Covid-19? No  Pt made aware of care partner policy and to bring her mask if she has one

## 2018-10-28 ENCOUNTER — Ambulatory Visit (AMBULATORY_SURGERY_CENTER): Payer: No Typology Code available for payment source | Admitting: Gastroenterology

## 2018-10-28 ENCOUNTER — Other Ambulatory Visit: Payer: Self-pay

## 2018-10-28 ENCOUNTER — Encounter: Payer: Self-pay | Admitting: Gastroenterology

## 2018-10-28 VITALS — BP 109/52 | HR 73 | Temp 98.9°F | Resp 17 | Ht 68.0 in | Wt 143.0 lb

## 2018-10-28 DIAGNOSIS — K648 Other hemorrhoids: Secondary | ICD-10-CM | POA: Diagnosis not present

## 2018-10-28 DIAGNOSIS — K625 Hemorrhage of anus and rectum: Secondary | ICD-10-CM | POA: Diagnosis not present

## 2018-10-28 MED ORDER — SODIUM CHLORIDE 0.9 % IV SOLN: 500.0000 mL | Freq: Once | INTRAVENOUS | Status: DC

## 2018-10-28 NOTE — Op Note (Signed)
[facility] Patient Name: Heather Mcneil Procedure Date: 10/28/2018 7:41 AM MRN: [id number] Endoscopist: Napoleon Form , MD Age: 18 Referring MD:  Date of Birth: 10-24-2000 Gender: Female Account #: [id number] Procedure:                Colonoscopy Indications:              Evaluation of unexplained GI bleeding presenting                            with rectal bleeding Procedure:                Pre-Anesthesia Assessment:                           - Prior to the procedure, a History and Physical                            was performed, and patient medications and                            allergies were reviewed. The patient's tolerance of                            previous anesthesia was also reviewed. The risks                            and benefits of the procedure and the sedation                            options and risks were discussed with the patient.                            All questions were answered, and informed consent                            was obtained. Prior Anticoagulants: The patient has                            taken no previous anticoagulant or antiplatelet                            agents. ASA Grade Assessment: I - A normal, healthy                            patient. After reviewing the risks and benefits,                            the patient was deemed in satisfactory condition to                            undergo the procedure.                           After obtaining informed consent, the colonoscope  was passed under direct vision. Throughout the                            procedure, the patient's blood pressure, pulse, and                            oxygen saturations were monitored continuously. The                            Colonoscope was introduced through the anus and                            advanced to the the terminal ileum, with                            identification of the appendiceal  orifice and IC                            valve. The colonoscopy was performed without                            difficulty. The patient tolerated the procedure                            well. The quality of the bowel preparation was                            excellent. The terminal ileum, ileocecal valve,                            appendiceal orifice, and rectum were photographed. Scope In: 7:44:23 AM Scope Out: 7:58:18 AM Scope Withdrawal Time: 0 hours 7 minutes 39 seconds  Total Procedure Duration: 0 hours 13 minutes 55 seconds  Findings:                 The perianal and digital rectal examinations were                            normal.                           Non-bleeding internal hemorrhoids were found during                            retroflexion. The hemorrhoids were medium-sized.                           The exam was otherwise without abnormality. Complications:            No immediate complications. Impression:               - Non-bleeding internal hemorrhoids, medium size                            likely etiology of rectal bleeding.                           -  The examination was otherwise normal. Negative                            for colitis or proctitis.                           - No specimens collected. Recommendation:           - Patient has a contact number available for                            emergencies. The signs and symptoms of potential                            delayed complications were discussed with the                            patient. Return to normal activities tomorrow.                            Written discharge instructions were provided to the                            patient.                           - Resume previous diet.                           - Continue present medications.                           - Use Benefiber one teaspoon PO TID.                           - Avoid excessive straining during defecation                            - If continues to have persistent bleeding or                            symptomatic hemorrhoids, can consider hemorrhoidal                            band ligation Napoleon FormKavitha V. Marge Vandermeulen, MD 10/28/2018 8:09:39 AM This report has been signed electronically.

## 2018-10-28 NOTE — Progress Notes (Signed)
PT taken to PACU. Monitors in place. VSS. Report given to RN. 

## 2018-10-28 NOTE — Patient Instructions (Signed)
Handout given for hemorrhoids.  Start benefiber one teaspoon three times per day.  YOU HAD AN ENDOSCOPIC PROCEDURE TODAY AT THE [facility]:   Refer to the procedure report that was given to you for any specific questions about what was found during the examination.  If the procedure report does not answer your questions, please call your gastroenterologist to clarify.  If you requested that your care partner not be given the details of your procedure findings, then the procedure report has been included in a sealed envelope for you to review at your convenience later.  YOU SHOULD EXPECT: Some feelings of bloating in the abdomen. Passage of more gas than usual.  Walking can help get rid of the air that was put into your GI tract during the procedure and reduce the bloating. If you had a lower endoscopy (such as a colonoscopy or flexible sigmoidoscopy) you may notice spotting of blood in your stool or on the toilet paper. If you underwent a bowel prep for your procedure, you may not have a normal bowel movement for a few days.  Please Note:  You might notice some irritation and congestion in your nose or some drainage.  This is from the oxygen used during your procedure.  There is no need for concern and it should clear up in a day or so.  SYMPTOMS TO REPORT IMMEDIATELY:   Following lower endoscopy (colonoscopy or flexible sigmoidoscopy):  Excessive amounts of blood in the stool  Significant tenderness or worsening of abdominal pains  Swelling of the abdomen that is new, acute  Fever of 100F or higher   For urgent or emergent issues, a gastroenterologist can be reached at any hour by calling [phone].   DIET:  We do recommend a small meal at first, but then you may proceed to your regular diet.  Drink plenty of fluids but you should avoid alcoholic beverages for 24 hours.  ACTIVITY:  You should plan to take it easy for the rest of today and you should NOT DRIVE or use  heavy machinery until tomorrow (because of the sedation medicines used during the test).    FOLLOW UP: Our staff will call the number listed on your records 48-72 hours following your procedure to check on you and address any questions or concerns that you may have regarding the information given to you following your procedure. If we do not reach you, we will leave a message.  We will attempt to reach you two times.  During this call, we will ask if you have developed any symptoms of COVID 19. If you develop any symptoms (for example fever, flu-like symptoms, shortness of breath, cough etc.) before then, please call [phone].  If any biopsies were taken you will be contacted by phone or by letter within the next 1-3 weeks.  Please call us at [phone] if you have not heard about the biopsies in 3 weeks.    SIGNATURES/CONFIDENTIALITY: You and/or your care partner have signed paperwork which will be entered into your electronic medical record.  These signatures attest to the fact that that the information above on your After Visit Summary has been reviewed and is understood.  Full responsibility of the confidentiality of this discharge information lies with you and/or your care-partner.

## 2018-10-28 NOTE — Progress Notes (Signed)
June Bullock - screening Covid-19 Janalee Dane, LPN - vs

## 2018-10-30 ENCOUNTER — Telehealth: Payer: Self-pay

## 2018-10-30 NOTE — Telephone Encounter (Signed)
NO ANSWER, MESSAGE LEFT FOR PATIENT. 

## 2018-10-31 ENCOUNTER — Telehealth: Payer: Self-pay | Admitting: *Deleted

## 2018-10-31 NOTE — Telephone Encounter (Signed)
  Follow up Call-  Call back number 10/28/2018  Post procedure Call Back phone  # #[phone] cell  Permission to leave phone message Yes  Some recent data might be hidden     Patient questions:  Do you have a fever, pain , or abdominal swelling? No. Pain Score  0 *  Have you tolerated food without any problems? Yes.    Have you been able to return to your normal activities? Yes.    Do you have any questions about your discharge instructions: Diet   No. Medications  No. Follow up visit  No.  Do you have questions or concerns about your Care? No.  Actions: * If pain score is 4 or above: No action needed, pain <4.  1. Have you developed a fever since your procedure? no  2.   Have you had an respiratory symptoms (SOB or cough) since your procedure? no  3.   Have you tested positive for COVID 19 since your procedure no  4.   Have you had any family members/close contacts diagnosed with the COVID 19 since your procedure?  no   If any of these questions are a yes, please inquire if patient has been seen by family doctor and route this note to Laverna Peace, Charity fundraiser.

## 2018-11-12 MED FILL — FEMYNOR 0.25-35 MG-MCG TABS: 0.25-35 | 84 days supply | Qty: 84 | Fill #1

## 2018-12-25 MED FILL — SPIRONOLACTONE 100 MG TAB: 100 | 30 days supply | Qty: 30 | Fill #0

## 2019-01-28 MED FILL — SPIRONOLACTONE 100 MG TAB: 100 | 30 days supply | Qty: 30 | Fill #1

## 2019-01-28 MED FILL — FEMYNOR 0.25-35 MG-MCG TABS: 0.25-35 | 84 days supply | Qty: 84 | Fill #2

## 2019-02-27 MED FILL — SPIRONOLACTONE 100 MG TAB: 100 | 30 days supply | Qty: 30 | Fill #2

## 2019-04-15 MED FILL — SPIRONOLACTONE 100 MG TAB: 100 | 90 days supply | Qty: 90 | Fill #0

## 2019-05-14 MED FILL — NORGESTIMATE-ETH ESTRADIOL: 0.25-35 | 84 days supply | Qty: 84 | Fill #3

## 2019-06-30 MED FILL — TRIAZOLAM 0.25 MG TABLET: 0.25 | 2 days supply | Qty: 2 | Fill #0

## 2019-07-15 MED FILL — SPIRONOLACTONE 100 MG TAB: 100 | 90 days supply | Qty: 90 | Fill #1

## 2019-07-15 MED FILL — TRIAZOLAM 0.25 MG TABS: 0.25 | 2 days supply | Qty: 2 | Fill #0

## 2019-08-26 MED FILL — VYLIBRA 0.25-35 MG-MCG TABS: 0.25-35 | 84 days supply | Qty: 84 | Fill #0

## 2019-08-27 ENCOUNTER — Ambulatory Visit: Payer: No Typology Code available for payment source | Attending: Internal Medicine

## 2019-08-27 DIAGNOSIS — Z23 Encounter for immunization: Secondary | ICD-10-CM

## 2019-08-27 NOTE — Progress Notes (Signed)
   Covid-19 Vaccination Clinic  Name:  KYNESHA GUERIN    MRN: [id number] DOB: [birth year]  08/27/2019  Ms. Cupps was observed post Covid-19 immunization for 15 minutes without incident. She was provided with Vaccine Information Sheet and instruction to access the V-Safe system.   Ms. Haskin was instructed to call 911 with any severe reactions post vaccine: Marland Kitchen Difficulty breathing  . Swelling of face and throat  . A fast heartbeat  . A bad rash all over body  . Dizziness and weakness   Immunizations Administered    Name Date Dose VIS Date Route   Pfizer COVID-19 Vaccine 08/27/2019  2:21 PM 0.3 mL 05/22/2019 Intramuscular   Manufacturer: [employer], [employer]   Lot: [id number]   NDC: [address]-4

## 2019-09-21 ENCOUNTER — Ambulatory Visit: Payer: No Typology Code available for payment source | Attending: Internal Medicine

## 2019-09-21 DIAGNOSIS — Z23 Encounter for immunization: Secondary | ICD-10-CM

## 2019-09-21 NOTE — Progress Notes (Signed)
   Covid-19 Vaccination Clinic  Name:  TEMARI SCHOOLER    MRN: [id number] DOB: 09/27/2000  09/21/2019  Ms. Minor was observed post Covid-19 immunization for 15 minutes without incident. She was provided with Vaccine Information Sheet and instruction to access the V-Safe system.   Ms. Sprankle was instructed to call 911 with any severe reactions post vaccine: Marland Kitchen Difficulty breathing  . Swelling of face and throat  . A fast heartbeat  . A bad rash all over body  . Dizziness and weakness   Immunizations Administered    Name Date Dose VIS Date Route   Pfizer COVID-19 Vaccine 09/21/2019  4:33 PM 0.3 mL 05/22/2019 Intramuscular   Manufacturer: [employer], [employer]   Lot: [id number]   NDC: [address]-8

## 2019-10-07 MED FILL — SPIRONOLACTONE 100 MG TAB: 100 | 90 days supply | Qty: 90 | Fill #2

## 2019-11-06 MED FILL — VYLIBRA 0.25-35 MG-MCG TABS: 0.25-35 | 84 days supply | Qty: 84 | Fill #1

## 2019-11-08 IMAGING — US US ABDOMEN LIMITED
1 series · 14 of 25 positions shown · non-contrast
Comparison: None.

CLINICAL DATA: Right upper quadrant pain.

EXAM:
ULTRASOUND ABDOMEN LIMITED RIGHT UPPER QUADRANT

[Series 1: us abdomen limited · 0.15mm/px · 14 of 54 slices shown]
[im 1/54]
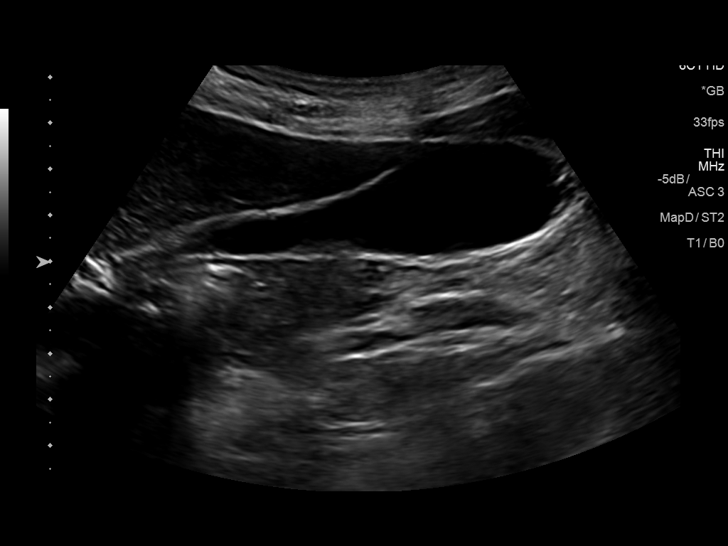
[im 5/54]
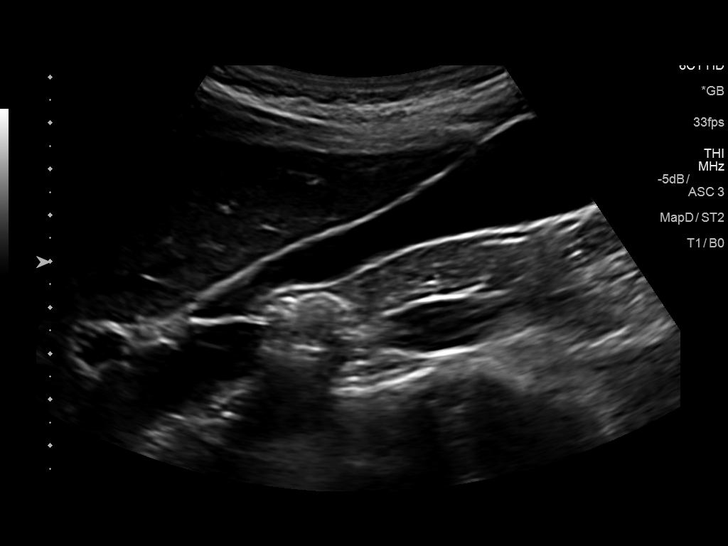
[im 9/54]
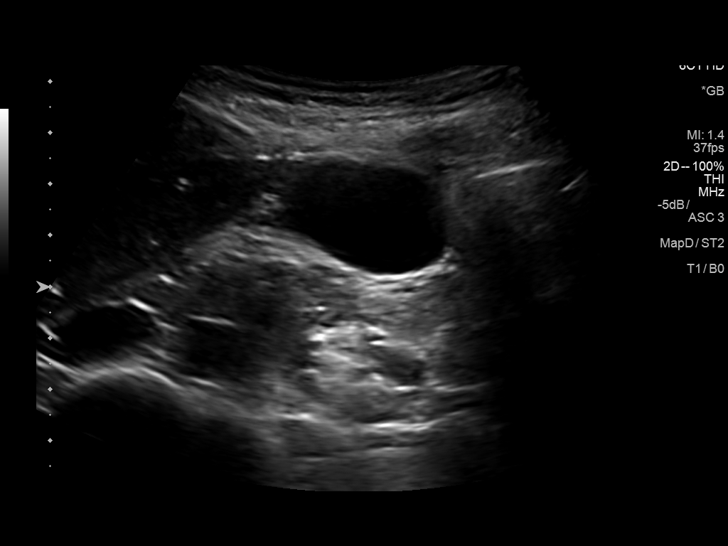
[im 14/54]
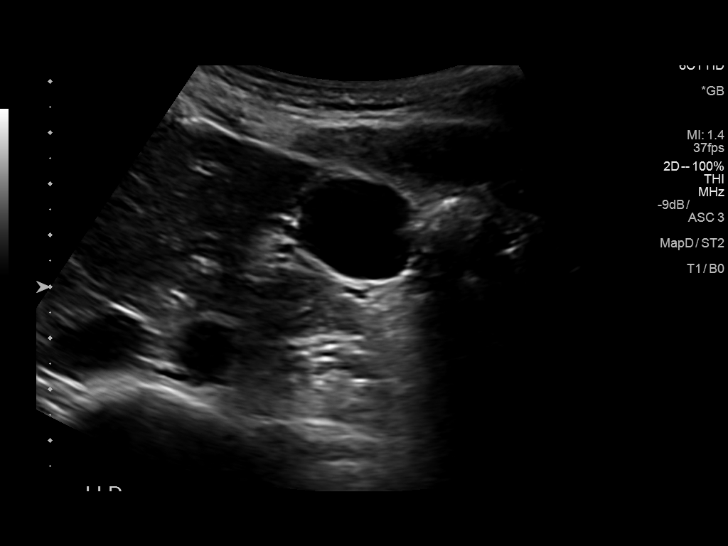
[im 18/54]
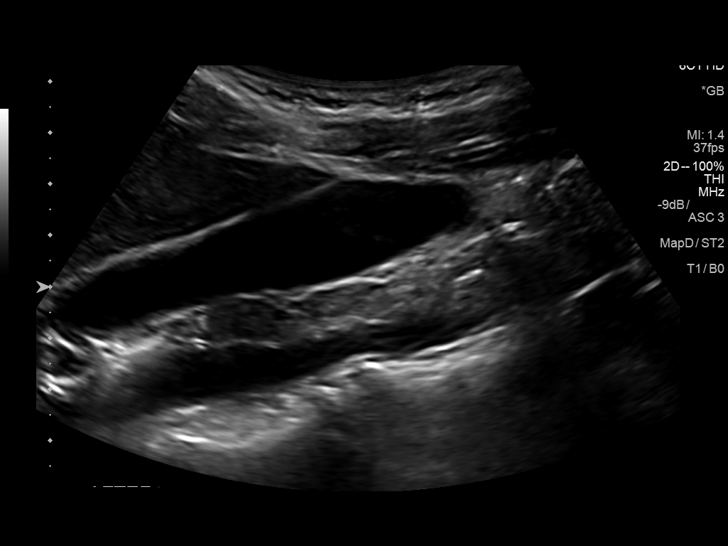
[im 20/54]
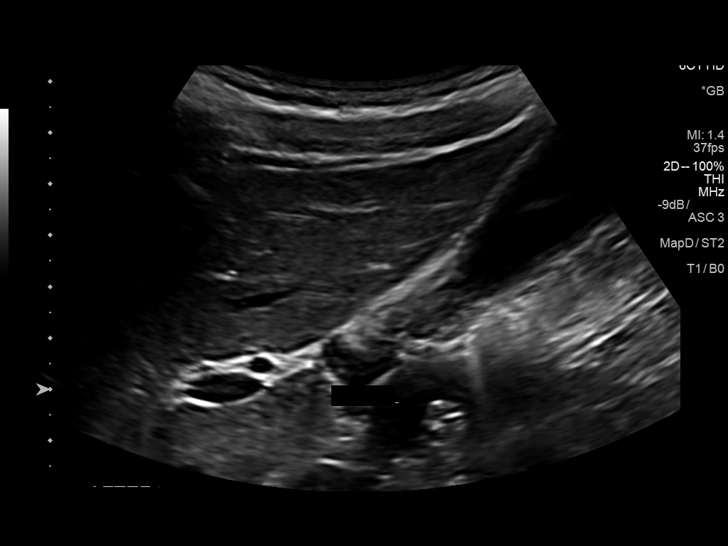
[im 25/54]
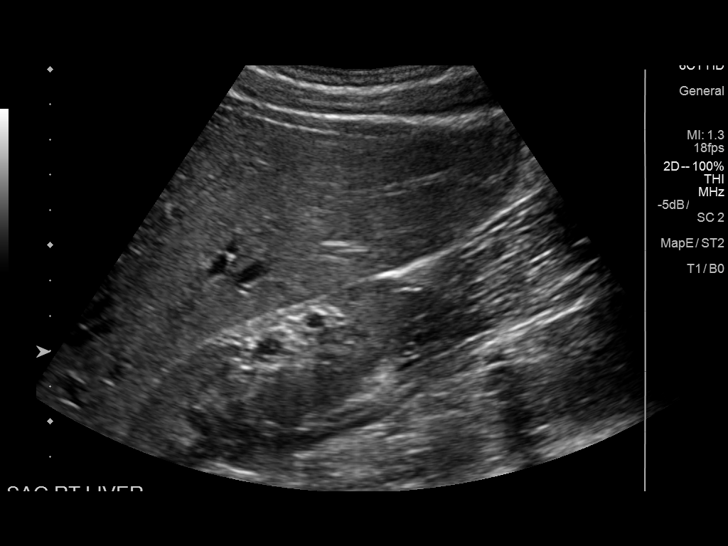
[im 29/54]
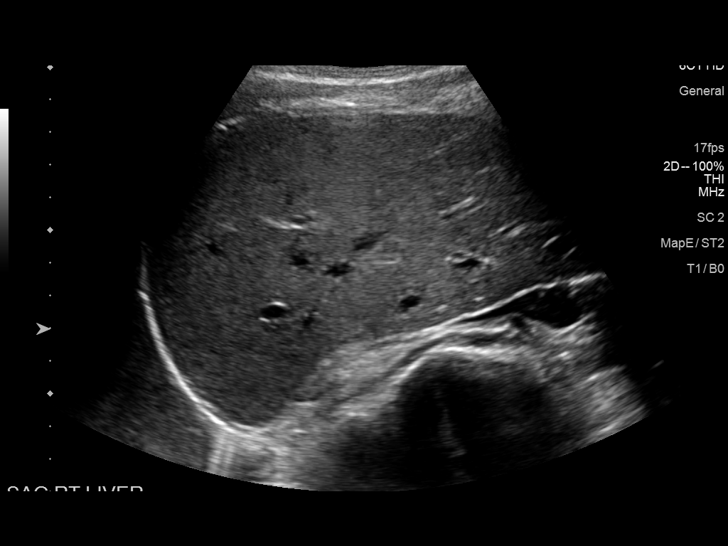
[im 34/54]
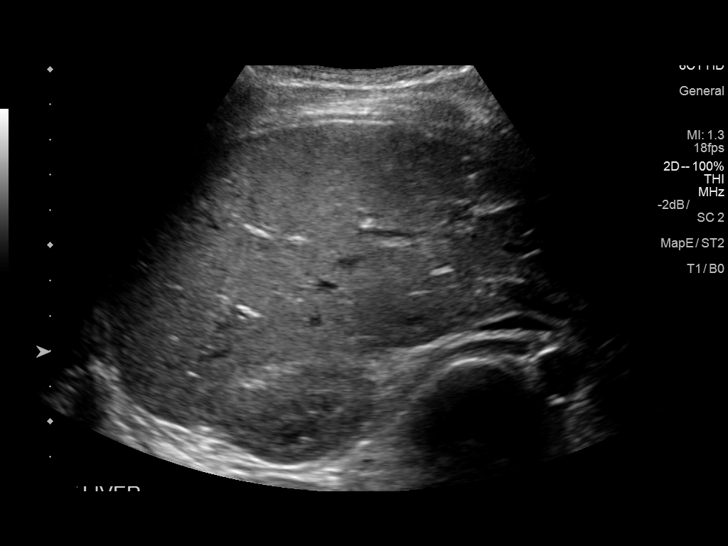
[im 36/54]
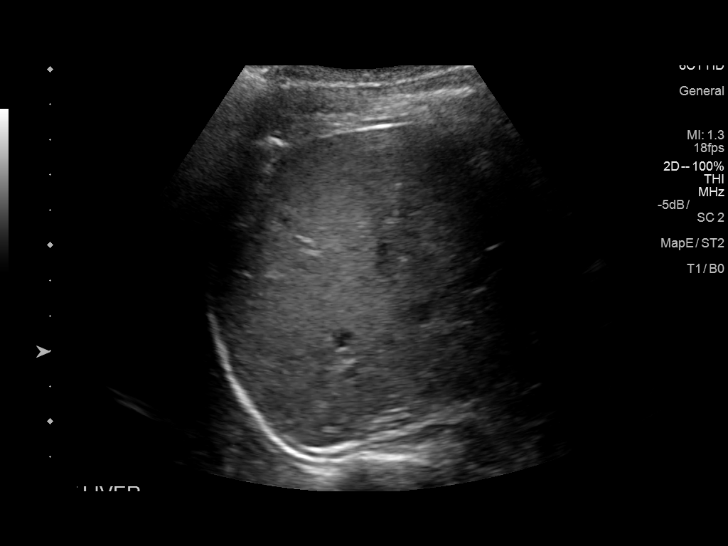
[im 40/54]
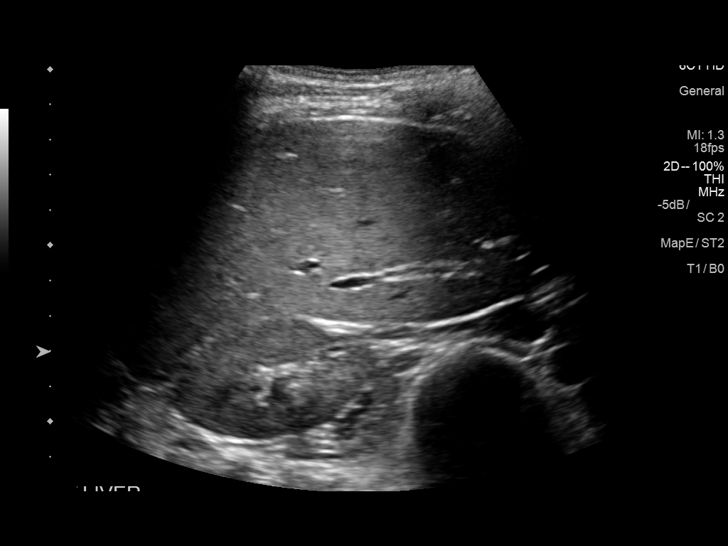
[im 45/54]
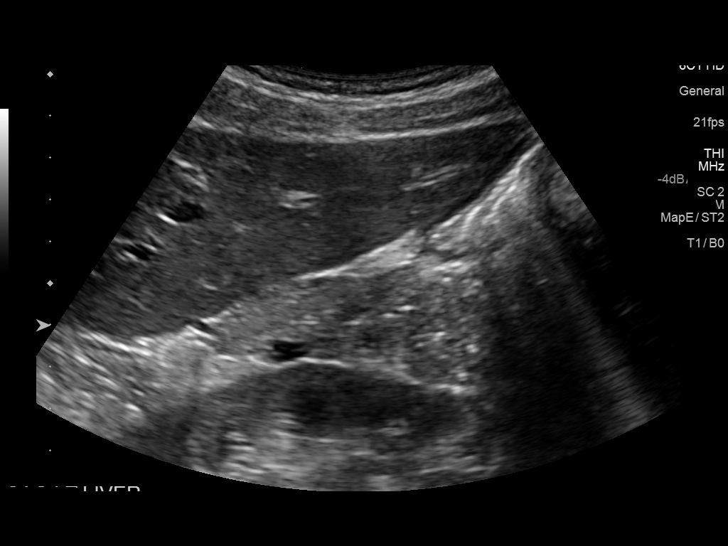
[im 49/54]
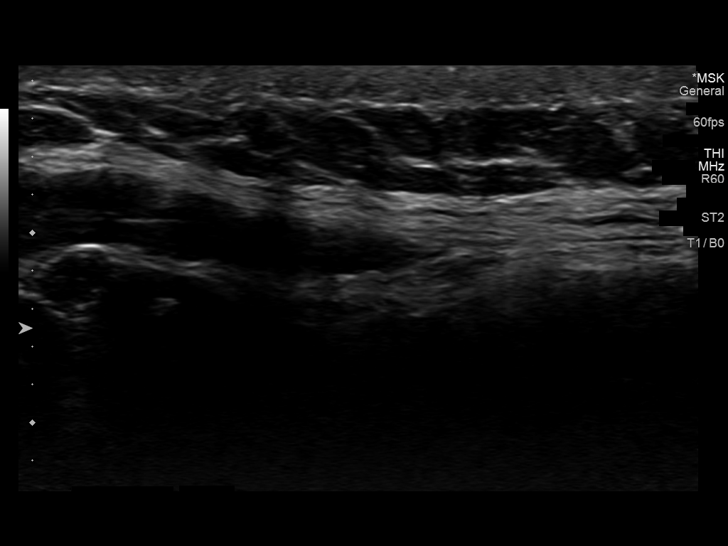
[im 54/54]
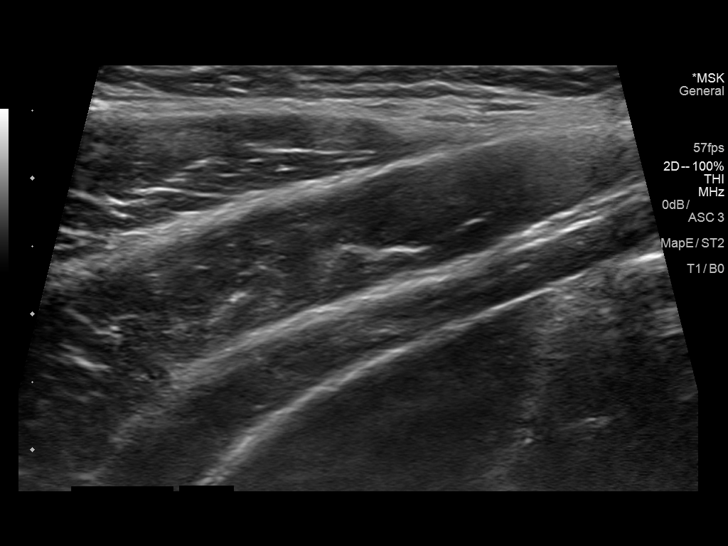

[14 of 25 positions shown; findings below may reference images not displayed]

FINDINGS: Gallbladder:

No gallstones or wall thickening visualized. No sonographic Murphy
sign noted by sonographer.

Common bile duct:

Diameter: 2.4 mm

Liver:

No focal abnormality. Normal echogenicity. Portal vein is patent on
color Doppler imaging with normal direction of blood flow towards
the liver.
IMPRESSION: No acute or focal abnormality identified.

## 2020-01-05 ENCOUNTER — Other Ambulatory Visit (HOSPITAL_COMMUNITY): Payer: Self-pay | Admitting: Obstetrics and Gynecology

## 2020-01-05 MED FILL — SPIRONOLACTONE 100 MG TAB: 100 | 90 days supply | Qty: 90 | Fill #0

## 2020-02-10 MED FILL — VYLIBRA 0.25-35 MG-MCG TABS: 0.25-35 | 84 days supply | Qty: 84 | Fill #2

## 2020-03-25 IMAGING — US US SOFT TISSUE HEAD/NECK
1 series · 5 of 5 positions shown · non-contrast
Comparison: None.

CLINICAL DATA: Acute lymphadenitis.

EXAM:
ULTRASOUND OF HEAD/NECK SOFT TISSUES
TECHNIQUE: Ultrasound examination of the head and neck soft tissues was
performed in the area of clinical concern.

[Series 1: us soft tissue head/neck · 0.05mm/px · 5 acquisitions, 5 frames shown]
[im 1/5]
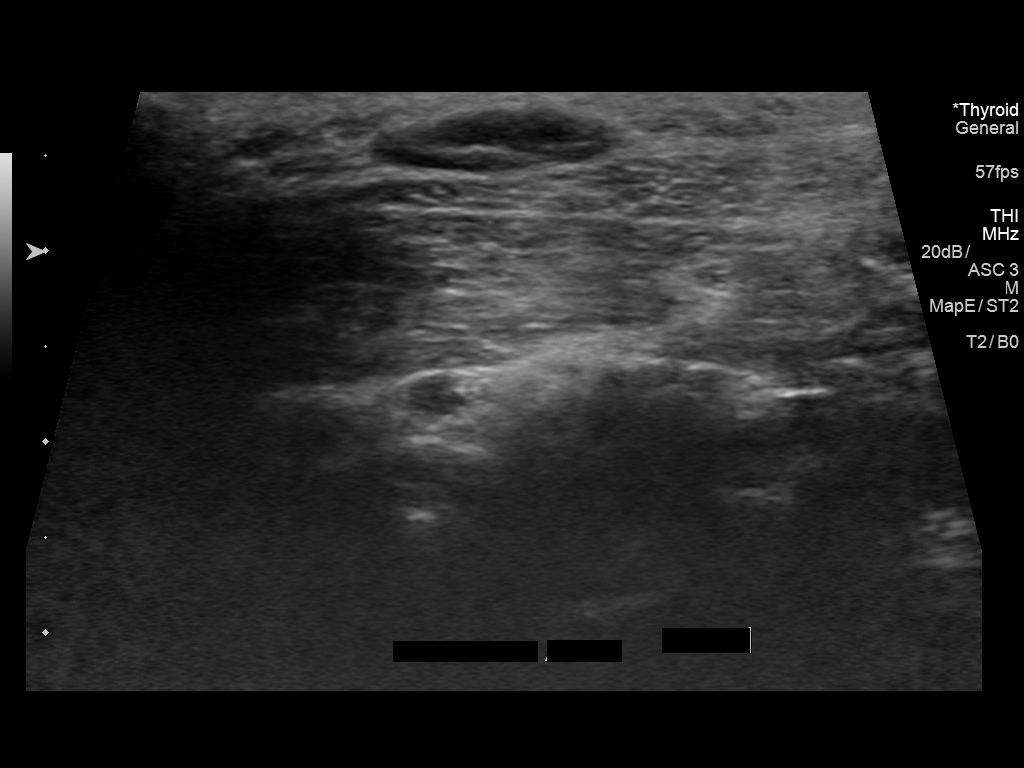
[im 2/5]
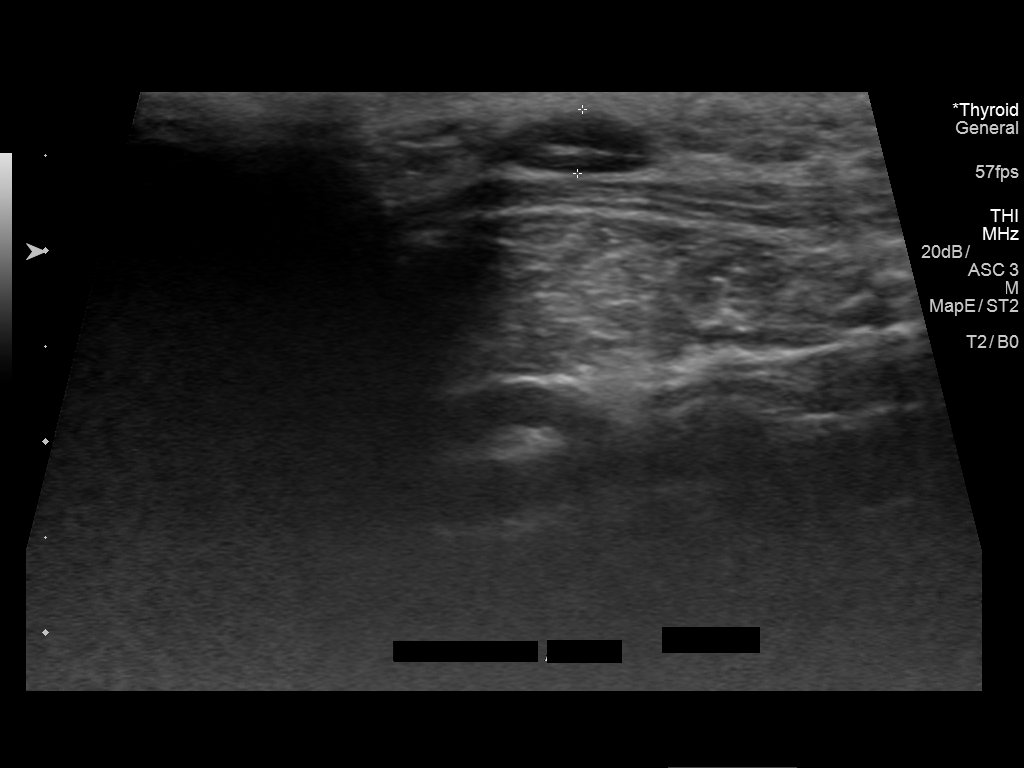
[im 3/5]
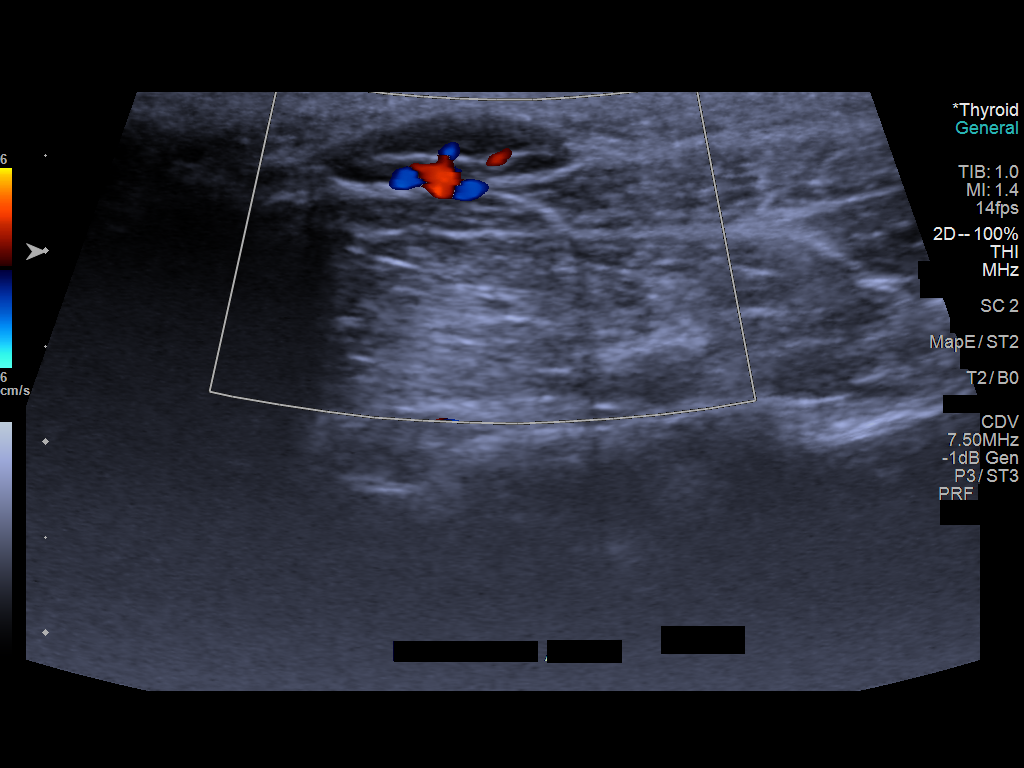
[im 4/5]
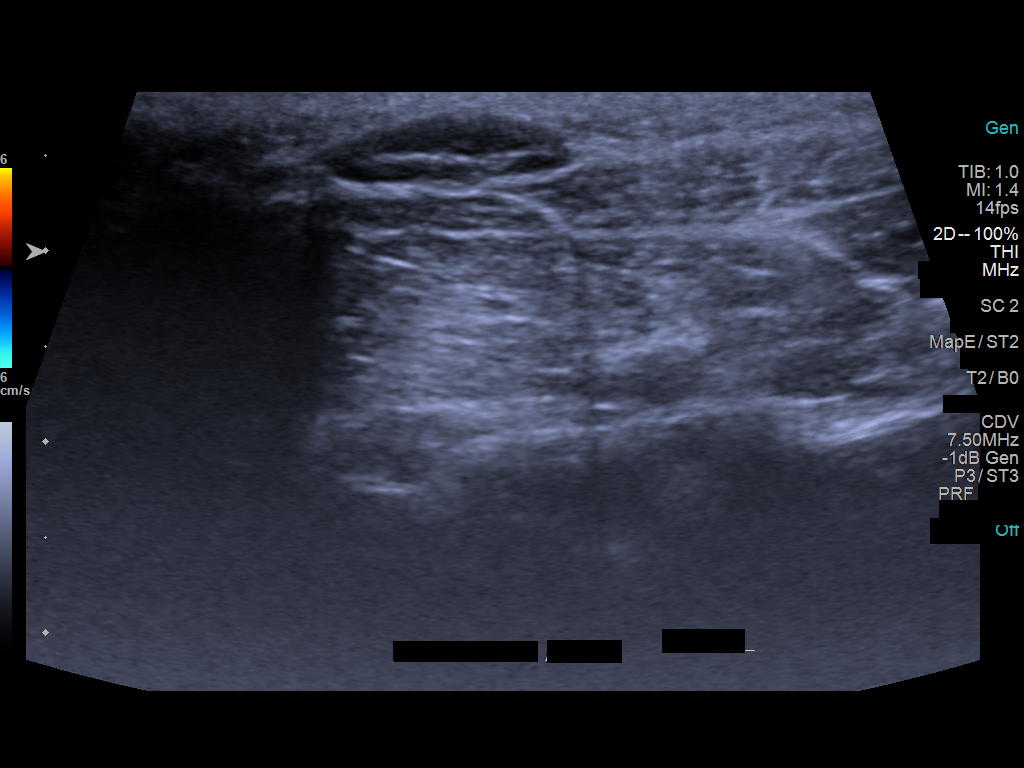
[im 5/5]
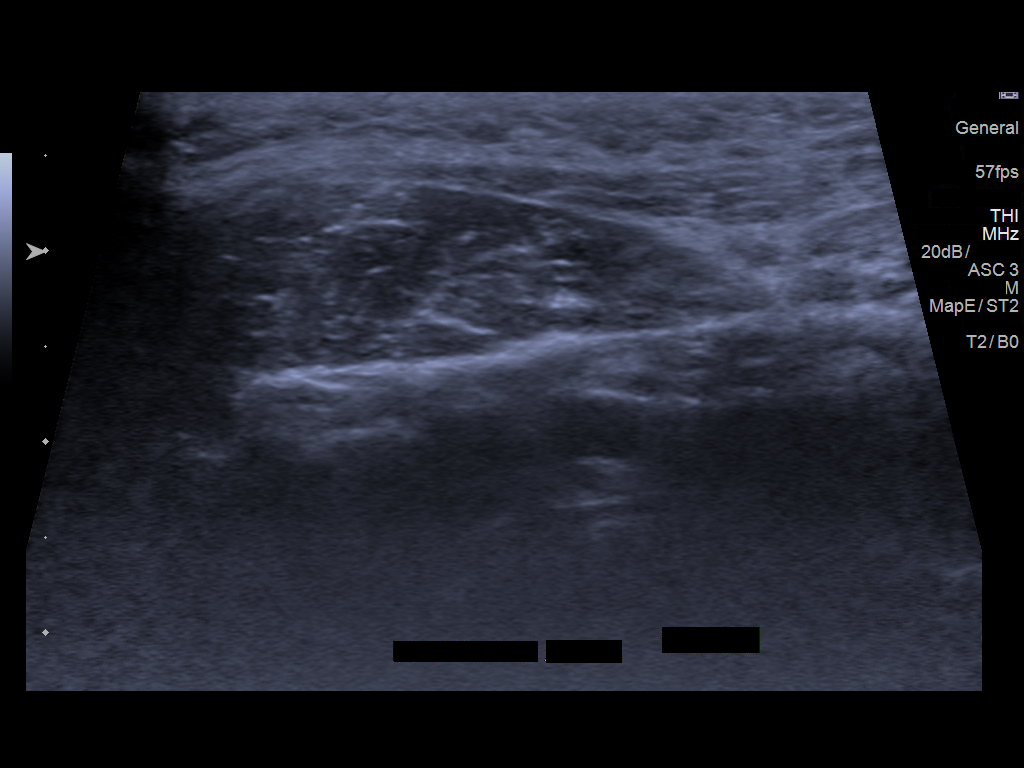

[5 of 5 positions shown; findings below may reference images not displayed]

FINDINGS: Scanning in the right posterolateral neck near the hairline
demonstrates a subcutaneous lymph node measuring 3 mm in short axis
dimension. Fatty hilum is present. No solid mass or cyst.
IMPRESSION: Benign 3 mm lymph node in the area of clinical concern right
posterolateral neck.

## 2020-04-28 MED FILL — VYLIBRA 0.25-35 MG-MCG TABS: 0.25-35 | 84 days supply | Qty: 84 | Fill #3

## 2020-05-19 MED FILL — spIRONOLACTONE 100 MG TAB: 100 | 90 days supply | Qty: 90 | Fill #1

## 2020-07-21 ENCOUNTER — Other Ambulatory Visit (HOSPITAL_COMMUNITY): Payer: Self-pay | Admitting: Obstetrics and Gynecology

## 2020-07-21 MED FILL — VYLIBRA 0.25-35 MG-MCG TABS: 0.25-35 | 84 days supply | Qty: 84 | Fill #0

## 2020-08-30 ENCOUNTER — Other Ambulatory Visit (HOSPITAL_BASED_OUTPATIENT_CLINIC_OR_DEPARTMENT_OTHER): Payer: Self-pay

## 2020-08-31 ENCOUNTER — Other Ambulatory Visit (HOSPITAL_COMMUNITY): Payer: Self-pay | Admitting: Obstetrics and Gynecology

## 2020-08-31 MED FILL — spIRONOLACTONE 25 MG TABS: 25 | 90 days supply | Qty: 90 | Fill #0

## 2020-09-05 ENCOUNTER — Other Ambulatory Visit (HOSPITAL_COMMUNITY): Payer: Self-pay | Admitting: Obstetrics and Gynecology

## 2020-10-26 ENCOUNTER — Other Ambulatory Visit (HOSPITAL_COMMUNITY): Payer: Self-pay

## 2020-10-26 MED FILL — Norgestimate & Ethinyl Estradiol Tab 0.25 MG-35 MCG: ORAL | 84 days supply | Qty: 84 | Fill #0 | Status: AC

## 2020-10-26 MED FILL — Spironolactone Tab 100 MG: ORAL | 90 days supply | Qty: 90 | Fill #0 | Status: AC

## 2021-01-25 ENCOUNTER — Other Ambulatory Visit (HOSPITAL_COMMUNITY): Payer: Self-pay

## 2021-01-25 MED FILL — Norgestimate & Ethinyl Estradiol Tab 0.25 MG-35 MCG: ORAL | 84 days supply | Qty: 84 | Fill #1 | Status: AC

## 2021-03-19 MED FILL — Spironolactone Tab 100 MG: ORAL | 90 days supply | Qty: 90 | Fill #1 | Status: AC

## 2021-03-20 ENCOUNTER — Other Ambulatory Visit (HOSPITAL_COMMUNITY): Payer: Self-pay

## 2021-03-23 ENCOUNTER — Other Ambulatory Visit (HOSPITAL_COMMUNITY): Payer: Self-pay

## 2021-04-20 MED FILL — Norgestimate & Ethinyl Estradiol Tab 0.25 MG-35 MCG: ORAL | 84 days supply | Qty: 84 | Fill #0 | Status: AC

## 2021-04-21 ENCOUNTER — Other Ambulatory Visit (HOSPITAL_COMMUNITY): Payer: Self-pay

## 2021-07-17 ENCOUNTER — Other Ambulatory Visit (HOSPITAL_COMMUNITY): Payer: Self-pay

## 2021-07-17 MED ORDER — SPIRONOLACTONE 100 MG PO TABS
100.0000 mg | ORAL_TABLET | Freq: Every day | ORAL | 0 refills | Status: AC
  Filled 2021-07-17: qty 90, 90d supply, fill #0

## 2021-07-17 MED FILL — Norgestimate & Ethinyl Estradiol Tab 0.25 MG-35 MCG: ORAL | 84 days supply | Qty: 84 | Fill #1 | Status: AC

## 2021-07-18 ENCOUNTER — Other Ambulatory Visit (HOSPITAL_COMMUNITY): Payer: Self-pay

## 2021-10-18 ENCOUNTER — Other Ambulatory Visit (HOSPITAL_COMMUNITY): Payer: Self-pay

## 2021-10-18 MED ORDER — DROSPIRENONE-ETHINYL ESTRADIOL 3-0.02 MG PO TABS
ORAL_TABLET | ORAL | 3 refills | Status: DC
  Filled 2021-10-18: qty 84, 84d supply, fill #0
  Filled 2022-01-28: qty 84, 84d supply, fill #1
  Filled 2022-04-24: qty 84, 84d supply, fill #2
  Filled 2022-07-13: qty 84, 84d supply, fill #3

## 2021-10-19 ENCOUNTER — Telehealth: Payer: Self-pay

## 2021-10-19 NOTE — Telephone Encounter (Signed)
NOTES SCANNED TO REFERRAL 

## 2021-11-01 DIAGNOSIS — L709 Acne, unspecified: Secondary | ICD-10-CM | POA: Insufficient documentation

## 2021-11-02 ENCOUNTER — Ambulatory Visit: Payer: No Typology Code available for payment source | Admitting: Cardiology

## 2021-11-02 ENCOUNTER — Encounter: Payer: Self-pay | Admitting: Cardiology

## 2021-11-02 VITALS — BP 118/76 | HR 78 | Ht 68.0 in | Wt 149.0 lb

## 2021-11-02 DIAGNOSIS — R002 Palpitations: Secondary | ICD-10-CM | POA: Diagnosis not present

## 2021-11-02 NOTE — Patient Instructions (Addendum)
Medication Instructions:  Your physician recommends that you continue on your current medications as directed. Please refer to the Current Medication list given to you today.  *If you need a refill on your cardiac medications before your next appointment, please call your pharmacy*   Testing/Procedures: Your physician has requested that you have an echocardiogram. Echocardiography is a painless test that uses sound waves to create images of your heart. It provides your doctor with information about the size and shape of your heart and how well your heart's chambers and valves are working. This procedure takes approximately one hour. There are no restrictions for this procedure.  Your physician has recommended that you wear an event monitor. Event monitors are medical devices that record the heart's electrical activity. Doctors most often [place] these monitors to diagnose arrhythmias. Arrhythmias are problems with the speed or rhythm of the heartbeat. The monitor is a small, portable device. You can wear one while you do your normal daily activities. This is usually used to diagnose what is causing palpitations/syncope (passing out).  Follow-Up: At [facility], you and your health needs are our priority.  As part of our continuing mission to provide you with exceptional heart care, we have created designated Provider Care Teams.  These Care Teams include your primary Cardiologist (physician) and Advanced Practice Providers (APPs -  Physician Assistants and Nurse Practitioners) who all work together to provide you with the care you need, when you need it.   Your next appointment:   As needed based on results of testing  The format for your next appointment:   In Person  Provider:   Armanda Magic, MD     [employer] Cardiac Event Monitor Instructions Your physician has requested you wear your cardiac event monitor for 30 days. [employer] may call or text to confirm a shipping address. The monitor  will be sent to a land address via UPS. [employer] will not ship a monitor to a PO BOX. It typically takes 3-5 days to receive your monitor after it has been enrolled. [employer] will assist with USPS tracking if your package is delayed. The telephone number for [employer] is [phone]. Once you have received your monitor, please review the enclosed instructions. Instruction tutorials can also be viewed under help and settings on the enclosed cell phone. Your monitor has already been registered assigning a specific monitor serial # to you.  Applying the monitor Remove cell phone from case and turn it on. The cell phone works as IT consultant and needs to be within [employer] of you at all times. The cell phone will need to be charged on a daily basis. We recommend you plug the cell phone into the enclosed charger at your bedside table every night.  Monitor batteries: You will receive two monitor batteries labelled #1 and #2. These are your recorders. Plug battery #2 onto the second connection on the enclosed charger. Keep one battery on the charger at all times. This will keep the monitor battery deactivated. It will also keep it fully charged for when you need to switch your monitor batteries. A small light will be blinking on the battery emblem when it is charging. The light on the battery emblem will remain on when the battery is fully charged.  Open package of a Monitor strip. Insert battery #1 into black hood on strip and gently squeeze monitor battery onto connection as indicated in instruction booklet. Set aside while preparing skin.  Choose location for your strip, vertical or horizontal,  as indicated in the instruction booklet. Shave to remove all hair from location. There cannot be any lotions, oils, powders, or colognes on skin where monitor is to be applied. Wipe skin clean with enclosed Saline wipe. Dry skin completely.  Peel paper labeled #1 off the back of the Monitor  strip exposing the adhesive. Place the monitor on the chest in the vertical or horizontal position shown in the instruction booklet. One arrow on the monitor strip must be pointing upward. Carefully remove paper labeled #2, attaching remainder of strip to your skin. Try not to create any folds or wrinkles in the strip as you apply it.  Firmly press and release the circle in the center of the monitor battery. You will hear a small beep. This is turning the monitor battery on. The heart emblem on the monitor battery will light up every 5 seconds if the monitor battery in turned on and connected to the patient securely. Do not push and hold the circle down as this turns the monitor battery off. The cell phone will locate the monitor battery. A screen will appear on the cell phone checking the connection of your monitor strip. This may read poor connection initially but change to good connection within the next minute. Once your monitor accepts the connection you will hear a series of 3 beeps followed by a climbing crescendo of beeps. A screen will appear on the cell phone showing the two monitor strip placement options. Touch the picture that demonstrates where you applied the monitor strip.  Your monitor strip and battery are waterproof. You are able to shower, bathe, or swim with the monitor on. They just ask you do not submerge deeper than 3 feet underwater. We recommend removing the monitor if you are swimming in a lake, river, or ocean.  Your monitor battery will need to be switched to a fully charged monitor battery approximately once a week. The cell phone will alert you of an action which needs to be made.  On the cell phone, tap for details to reveal connection status, monitor battery status, and cell phone battery status. The green dots indicates your monitor is in good status. A red dot indicates there is something that needs your attention.  To record a symptom, click the circle on  the monitor battery. In 30-60 seconds a list of symptoms will appear on the cell phone. Select your symptom and tap save. Your monitor will record a sustained or significant arrhythmia regardless of you clicking the button. Some patients do not feel the heart rhythm irregularities. [employer] will notify us of any serious or critical events.  Refer to instruction booklet for instructions on switching batteries, changing strips, the Do not disturb or Pause features, or any additional questions.  Call [employer] at [phone], to confirm your monitor is transmitting and record your baseline. They will answer any questions you may have regarding the monitor instructions at that time.  Returning the monitor to [employer] Place all equipment back into blue box. Peel off strip of paper to expose adhesive and close box securely. There is a prepaid UPS shipping label on this box. Drop in a UPS drop box, or at a UPS facility like [employer]. You may also contact [employer] to arrange UPS to pick up monitor package at your home.  Important Information About Sugar

## 2021-11-02 NOTE — Progress Notes (Signed)
Cardiology CONSULT Note    Date:  11/02/2021   ID:  Heather Mcneil, DOB 18-Jun-2000, MRN [id number]  PCP:  Cleatis Polka., MD  Cardiologist:  Armanda Magic, MD   Chief Complaint  Patient presents with   New Patient (Initial Visit)    Palpitations    History of Present Illness:  Heather Mcneil is a 21 y.o. female who is being seen today for the evaluation of palpitations at the request of Ranae Pila, *.  This is a 20yo female with a hx of HAs and palpitations who was referred by her GYn for evaluation. She has been having palpitations for the past 6 months sporadically.  She has been on spiro for PCOS and felt that she has been dehydrated at times and is actually stopping the diuretic.  She has had some episodes in the am and sometimes with an exercise she does at the gym she will notice palpitations.  Sometimes it occurs in the afternoon.  She says that there are 2 separate issues. The palpitations are a skipped heart beat that occur when she is at rest or in bed.  The other is a fast heart beat as high as the 190's that can occur just with drying her hair or exercise that is not strenuous.  She says that she was dizzy when her HR got into the 190's.  She denies any chest pain but has had some DOE when her HR is very high or has a lot of skipping of her heart beat.  She denies any LE edema or syncope.  She drinks 2 caffeinated drinks daily and 1 alcoholic drink a week. Her mother has PVCs and NSVT and HTN.  Her MGM has atrial fibrillation, CHF and multiple valve surgeries in the past.     Past Medical History:  Diagnosis Date   Abnormal cervical Papanicolaou smear    Headache    Palpitations    Polycystic ovary syndrome     Past Surgical History:  Procedure Laterality Date   NO PAST SURGERIES      Current Medications: Current Meds  Medication Sig   cetirizine (ZYRTEC) 10 MG tablet Take 10 mg by mouth daily.   drospirenone-ethinyl estradiol (YAZ) 3-0.02 MG tablet  Take 1 tablet by mouth every day   ketoconazole (NIZORAL) 2 % shampoo APPLY TO THE AFFECTED AREAS TWICE PER WEEK   spironolactone (ALDACTONE) 100 MG tablet TAKE 1 TABLET BY MOUTH ONCE DAILY    Allergies:   Patient has no known allergies.   Social History   Socioeconomic History   Marital status: Single    Spouse name: Not on file   Number of children: 0   Years of education: 12   Highest education level: Not on file  Occupational History   Not on file  Tobacco Use   Smoking status: Never   Smokeless tobacco: Never  Vaping Use   Vaping Use: Never used  Substance and Sexual Activity   Alcohol use: Not Currently   Drug use: Not Currently   Sexual activity: Not Currently    Birth control/protection: Abstinence, Pill  Other Topics Concern   Not on file  Social History Narrative   Not on file   Social Determinants of Health   Financial Resource Strain: Not on file  Food Insecurity: Not on file  Transportation Needs: Not on file  Physical Activity: Not on file  Stress: Not on file  Social Connections: Not on file  Family History:  The patient's family history includes Heart disease in her maternal grandmother; Hypertension in her maternal grandmother and mother.   ROS:   Please see the history of present illness.    ROS All other systems reviewed and are negative.      View : No data to display.             PHYSICAL EXAM:   VS:  BP 118/76   Pulse 78   Ht 5\' 8"  (1.727 m)   Wt 149 lb (67.6 kg)   SpO2 99%   BMI 22.66 kg/m    GEN: Well nourished, well developed, in no acute distress  HEENT: normal  Neck: no JVD, carotid bruits, or masses Cardiac: RRR; no murmurs, rubs, or gallops,no edema.  Intact distal pulses bilaterally.  Respiratory:  clear to auscultation bilaterally, normal work of breathing GI: soft, nontender, nondistended, + BS MS: no deformity or atrophy  Skin: warm and dry, no rash Neuro:  Alert and Oriented x 3, Strength and sensation are  intact Psych: euthymic mood, full affect  Wt Readings from Last 3 Encounters:  11/02/21 149 lb (67.6 kg)  10/28/18 143 lb (64.9 kg) (73 %, Z= 0.62)*  10/14/18 143 lb (64.9 kg) (73 %, Z= 0.62)*   * Growth percentiles are based on CDC (Girls, 2-20 Years) data.      Studies/Labs Reviewed:   EKG:  EKG is ordered today.  The ekg ordered today demonstrates NSR with iRBBB  Recent Labs: No results found for requested labs within last 8760 hours.   Lipid Panel No results found for: CHOL, TRIG, HDL, CHOLHDL, VLDL, LDLCALC, LDLDIRECT   Additional studies/ records that were reviewed today include:  OV notes from PCP    ASSESSMENT:    1. Palpitations      PLAN:  In order of problems listed above:  Palpitations -Some of her symptoms sound like POTS in that she has fast heart rates with minimal exertion but does not really get dizzy with them. -Orthostatic vitals in the office today show no evidence of orthostatic hypotension and no significant increase in heart rate with orthostatic BPs -She is going off her diuretic so we will be anxious to see if this helps her symptoms of palpitations -She actually has 2 types of palpitations 1 is increased heart rate with minimal exertion as high as 190 bpm the other is skipped heartbeats that are more noticeable at rest -She denies any significant alcohol use but does drink some caffeinated drinks -I will get a 30-day event monitor to assess for arrhythmias  2.  Incomplete RBBB -Check 2D D echo to rule out ASD or PFO  Time Spent: 20 minutes total time of encounter, including 15 minutes spent in face-to-face patient care on the date of this encounter. This time includes coordination of care and counseling regarding above mentioned problem list. Remainder of non-face-to-face time involved reviewing chart documents/testing relevant to the patient encounter and documentation in the medical record. I have independently reviewed documentation from  referring provider  Medication Adjustments/Labs and Tests Ordered: Current medicines are reviewed at length with the patient today.  Concerns regarding medicines are outlined above.  Medication changes, Labs and Tests ordered today are listed in the Patient Instructions below.  There are no Patient Instructions on file for this visit.   Signed, 12/14/18, MD  11/02/2021 1:19 PM    [facility] [address] [place], [place], [place]  [place] Phone: [phone];  Fax: [phone]

## 2021-11-22 ENCOUNTER — Other Ambulatory Visit: Payer: Self-pay | Admitting: Cardiology

## 2021-11-22 DIAGNOSIS — Z8249 Family history of ischemic heart disease and other diseases of the circulatory system: Secondary | ICD-10-CM

## 2021-11-22 DIAGNOSIS — R000 Tachycardia, unspecified: Secondary | ICD-10-CM

## 2021-11-25 ENCOUNTER — Ambulatory Visit (INDEPENDENT_AMBULATORY_CARE_PROVIDER_SITE_OTHER): Payer: No Typology Code available for payment source

## 2021-11-25 DIAGNOSIS — R002 Palpitations: Secondary | ICD-10-CM

## 2021-11-27 ENCOUNTER — Other Ambulatory Visit (HOSPITAL_COMMUNITY): Payer: No Typology Code available for payment source

## 2021-12-06 ENCOUNTER — Ambulatory Visit (INDEPENDENT_AMBULATORY_CARE_PROVIDER_SITE_OTHER): Payer: No Typology Code available for payment source

## 2021-12-06 DIAGNOSIS — Z8249 Family history of ischemic heart disease and other diseases of the circulatory system: Secondary | ICD-10-CM

## 2021-12-06 DIAGNOSIS — R000 Tachycardia, unspecified: Secondary | ICD-10-CM | POA: Diagnosis not present

## 2021-12-06 LAB — ECHOCARDIOGRAM COMPLETE
AR max vel: 2.76 cm2
AV Area VTI: 2.67 cm2
AV Area mean vel: 2.53 cm2
AV Mean grad: 3 mmHg
AV Peak grad: 5.3 mmHg
Ao pk vel: 1.15 m/s
Area-P 1/2: 5.46 cm2
Calc EF: 54 %
S' Lateral: 3.3 cm
Single Plane A2C EF: 53.8 %
Single Plane A4C EF: 54.4 %

## 2021-12-29 ENCOUNTER — Telehealth: Payer: Self-pay

## 2021-12-29 DIAGNOSIS — R002 Palpitations: Secondary | ICD-10-CM

## 2021-12-29 NOTE — Telephone Encounter (Signed)
The patient has been notified of the result and verbalized understanding.  All questions (if any) were answered. Theresia Majors, RN 12/29/2021 2:02 PM  Labs have been scheduled

## 2021-12-29 NOTE — Telephone Encounter (Signed)
-----   Message from Quintella Reichert, MD sent at 12/29/2021  1:40 PM EDT ----- Heart monitor showed occasional extra heartbeats from the top and bottom of the heart which are benign.  Some of these corresponded to symptoms but other times she had symptoms with no arrhythmias.2D echo showed normal heart function.  Please have her come in for TSH and bmet.  Avoid any caffeine or alcohol.

## 2022-01-15 ENCOUNTER — Other Ambulatory Visit: Payer: No Typology Code available for payment source

## 2022-01-17 ENCOUNTER — Other Ambulatory Visit: Payer: No Typology Code available for payment source

## 2022-01-17 DIAGNOSIS — R002 Palpitations: Secondary | ICD-10-CM

## 2022-01-17 LAB — BASIC METABOLIC PANEL
BUN/Creatinine Ratio: 9 (ref 9–23)
BUN: 8 mg/dL (ref 6–20)
CO2: 20 mmol/L (ref 20–29)
Calcium: 9.6 mg/dL (ref 8.7–10.2)
Chloride: 104 mmol/L (ref 96–106)
Creatinine, Ser: 0.9 mg/dL (ref 0.57–1.00)
Glucose: 121 mg/dL — ABNORMAL HIGH (ref 70–99)
Potassium: 3.9 mmol/L (ref 3.5–5.2)
Sodium: 138 mmol/L (ref 134–144)
eGFR: 93 mL/min/{1.73_m2} (ref >59)

## 2022-01-17 LAB — TSH: TSH: 1.44 u[IU]/mL (ref 0.450–4.500)

## 2022-01-29 ENCOUNTER — Other Ambulatory Visit (HOSPITAL_COMMUNITY): Payer: Self-pay

## 2022-01-29 MED ORDER — SPIRONOLACTONE 25 MG PO TABS
ORAL_TABLET | ORAL | 2 refills | Status: AC
  Filled 2022-01-29: qty 90, 90d supply, fill #0
  Filled 2022-07-13: qty 90, 90d supply, fill #1

## 2022-04-24 ENCOUNTER — Other Ambulatory Visit (HOSPITAL_COMMUNITY): Payer: Self-pay

## 2022-04-24 MED ORDER — SPIRONOLACTONE 50 MG PO TABS
50.0000 mg | ORAL_TABLET | Freq: Every day | ORAL | 3 refills | Status: AC
  Filled 2022-04-24: qty 90, 90d supply, fill #0
  Filled 2022-09-10: qty 90, 90d supply, fill #1
  Filled 2023-01-11: qty 90, 90d supply, fill #2
  Filled 2023-04-07: qty 90, 90d supply, fill #3

## 2022-04-25 ENCOUNTER — Other Ambulatory Visit (HOSPITAL_COMMUNITY): Payer: Self-pay

## 2022-07-16 ENCOUNTER — Other Ambulatory Visit (HOSPITAL_COMMUNITY): Payer: Self-pay

## 2022-09-10 ENCOUNTER — Other Ambulatory Visit (HOSPITAL_COMMUNITY): Payer: Self-pay

## 2022-09-11 ENCOUNTER — Other Ambulatory Visit: Payer: Self-pay

## 2022-09-12 ENCOUNTER — Other Ambulatory Visit (HOSPITAL_COMMUNITY): Payer: Self-pay

## 2022-09-12 MED ORDER — DROSPIRENONE-ETHINYL ESTRADIOL 3-0.02 MG PO TABS
1.0000 | ORAL_TABLET | Freq: Every day | ORAL | 0 refills | Status: DC
  Filled 2023-01-11: qty 84, 84d supply, fill #0

## 2022-10-23 ENCOUNTER — Other Ambulatory Visit (HOSPITAL_COMMUNITY): Payer: Self-pay

## 2022-10-23 ENCOUNTER — Other Ambulatory Visit: Payer: Self-pay

## 2022-10-23 DIAGNOSIS — L709 Acne, unspecified: Secondary | ICD-10-CM | POA: Diagnosis not present

## 2022-10-23 DIAGNOSIS — Z6822 Body mass index (BMI) 22.0-22.9, adult: Secondary | ICD-10-CM | POA: Diagnosis not present

## 2022-10-23 DIAGNOSIS — Z309 Encounter for contraceptive management, unspecified: Secondary | ICD-10-CM | POA: Diagnosis not present

## 2022-10-23 DIAGNOSIS — E282 Polycystic ovarian syndrome: Secondary | ICD-10-CM | POA: Diagnosis not present

## 2022-10-23 DIAGNOSIS — Z01419 Encounter for gynecological examination (general) (routine) without abnormal findings: Secondary | ICD-10-CM | POA: Diagnosis not present

## 2022-10-23 MED ORDER — DROSPIRENONE-ETHINYL ESTRADIOL 3-0.02 MG PO TABS
1.0000 | ORAL_TABLET | Freq: Every day | ORAL | 3 refills | Status: AC
  Filled 2022-10-23: qty 84, 84d supply, fill #0
  Filled 2023-04-07: qty 84, 84d supply, fill #1

## 2023-01-11 ENCOUNTER — Other Ambulatory Visit: Payer: Self-pay

## 2023-04-08 ENCOUNTER — Other Ambulatory Visit: Payer: Self-pay

## 2023-06-28 ENCOUNTER — Other Ambulatory Visit (HOSPITAL_COMMUNITY): Payer: Self-pay

## 2023-06-29 ENCOUNTER — Other Ambulatory Visit (HOSPITAL_COMMUNITY): Payer: Self-pay

## 2023-07-01 ENCOUNTER — Other Ambulatory Visit (HOSPITAL_COMMUNITY): Payer: Self-pay

## 2023-07-01 MED ORDER — DROSPIRENONE-ETHINYL ESTRADIOL 3-0.02 MG PO TABS
1.0000 | ORAL_TABLET | Freq: Every day | ORAL | 1 refills | Status: AC
  Filled 2023-07-01: qty 84, 84d supply, fill #0
  Filled 2024-01-12: qty 84, 84d supply, fill #1

## 2023-10-24 ENCOUNTER — Other Ambulatory Visit (HOSPITAL_COMMUNITY): Payer: Self-pay

## 2023-10-24 ENCOUNTER — Other Ambulatory Visit: Payer: Self-pay

## 2023-10-24 DIAGNOSIS — Z124 Encounter for screening for malignant neoplasm of cervix: Secondary | ICD-10-CM | POA: Diagnosis not present

## 2023-10-24 DIAGNOSIS — E282 Polycystic ovarian syndrome: Secondary | ICD-10-CM | POA: Diagnosis not present

## 2023-10-24 DIAGNOSIS — L709 Acne, unspecified: Secondary | ICD-10-CM | POA: Diagnosis not present

## 2023-10-24 DIAGNOSIS — R030 Elevated blood-pressure reading, without diagnosis of hypertension: Secondary | ICD-10-CM | POA: Diagnosis not present

## 2023-10-24 DIAGNOSIS — Z01419 Encounter for gynecological examination (general) (routine) without abnormal findings: Secondary | ICD-10-CM | POA: Diagnosis not present

## 2023-10-24 DIAGNOSIS — Z309 Encounter for contraceptive management, unspecified: Secondary | ICD-10-CM | POA: Diagnosis not present

## 2023-10-24 DIAGNOSIS — Z6822 Body mass index (BMI) 22.0-22.9, adult: Secondary | ICD-10-CM | POA: Diagnosis not present

## 2023-10-24 MED ORDER — DROSPIRENONE-ETHINYL ESTRADIOL 3-0.02 MG PO TABS
1.0000 | ORAL_TABLET | Freq: Every day | ORAL | 3 refills | Status: AC
  Filled 2023-10-24: qty 84, 84d supply, fill #0
  Filled 2024-04-18: qty 84, 84d supply, fill #1
  Filled 2024-07-07: qty 84, 84d supply, fill #2

## 2024-01-13 ENCOUNTER — Other Ambulatory Visit (HOSPITAL_COMMUNITY): Payer: Self-pay

## 2024-04-13 ENCOUNTER — Other Ambulatory Visit: Payer: Self-pay

## 2024-04-13 ENCOUNTER — Other Ambulatory Visit (HOSPITAL_COMMUNITY): Payer: Self-pay

## 2024-04-13 DIAGNOSIS — N760 Acute vaginitis: Secondary | ICD-10-CM | POA: Diagnosis not present

## 2024-04-13 DIAGNOSIS — N771 Vaginitis, vulvitis and vulvovaginitis in diseases classified elsewhere: Secondary | ICD-10-CM | POA: Diagnosis not present

## 2024-04-13 DIAGNOSIS — R8290 Unspecified abnormal findings in urine: Secondary | ICD-10-CM | POA: Diagnosis not present

## 2024-04-13 MED ORDER — METRONIDAZOLE 500 MG PO TABS
500.0000 mg | ORAL_TABLET | Freq: Two times a day (BID) | ORAL | 0 refills | Status: DC
  Filled 2024-04-13: qty 14, 7d supply, fill #0

## 2024-04-19 ENCOUNTER — Other Ambulatory Visit (HOSPITAL_COMMUNITY): Payer: Self-pay

## 2024-05-13 ENCOUNTER — Other Ambulatory Visit (HOSPITAL_COMMUNITY): Payer: Self-pay

## 2024-05-13 ENCOUNTER — Other Ambulatory Visit: Payer: Self-pay

## 2024-05-13 MED ORDER — MAINTAIN BORIC ACID 600 MG VA SUPP
VAGINAL | 0 refills | Status: AC
  Filled 2024-05-13: qty 30, 30d supply, fill #0

## 2024-05-14 ENCOUNTER — Other Ambulatory Visit: Payer: Self-pay

## 2024-05-15 ENCOUNTER — Other Ambulatory Visit: Payer: Self-pay

## 2024-05-16 ENCOUNTER — Other Ambulatory Visit (HOSPITAL_COMMUNITY): Payer: Self-pay

## 2024-06-01 ENCOUNTER — Other Ambulatory Visit (HOSPITAL_COMMUNITY): Payer: Self-pay

## 2024-06-01 ENCOUNTER — Other Ambulatory Visit: Payer: Self-pay

## 2024-06-01 MED ORDER — METRONIDAZOLE 500 MG PO TABS
500.0000 mg | ORAL_TABLET | Freq: Two times a day (BID) | ORAL | 0 refills | Status: AC
  Filled 2024-06-01: qty 14, 7d supply, fill #0

## 2024-06-01 MED ORDER — METRONIDAZOLE 500 MG PO TABS: ORAL_TABLET | ORAL | 0 refills | Status: AC

## 2024-07-07 ENCOUNTER — Other Ambulatory Visit: Payer: Self-pay
